# Patient Record
Sex: Female | Born: 1945 | Race: Black or African American | Hispanic: No | Smoking: Former smoker
Health system: Southern US, Community
[De-identification: ages and names within clinical notes are randomized; demographics above are authoritative.]

## PROBLEM LIST (undated history)

## (undated) DIAGNOSIS — J449 Chronic obstructive pulmonary disease, unspecified: Secondary | ICD-10-CM

## (undated) DIAGNOSIS — Z8679 Personal history of other diseases of the circulatory system: Secondary | ICD-10-CM

## (undated) DIAGNOSIS — Z95810 Presence of automatic (implantable) cardiac defibrillator: Secondary | ICD-10-CM

## (undated) DIAGNOSIS — C801 Malignant (primary) neoplasm, unspecified: Secondary | ICD-10-CM

## (undated) HISTORY — PX: ABDOMINAL HYSTERECTOMY: SHX81

---

## 2003-10-12 ENCOUNTER — Ambulatory Visit: Payer: Self-pay | Admitting: Oncology

## 2004-01-29 ENCOUNTER — Ambulatory Visit: Payer: Self-pay | Admitting: Oncology

## 2004-02-12 ENCOUNTER — Ambulatory Visit: Payer: Self-pay | Admitting: Oncology

## 2004-05-12 ENCOUNTER — Ambulatory Visit: Payer: Self-pay | Admitting: Oncology

## 2004-06-18 ENCOUNTER — Ambulatory Visit: Payer: Self-pay | Admitting: Gastroenterology

## 2004-06-29 ENCOUNTER — Ambulatory Visit: Payer: Self-pay | Admitting: Oncology

## 2004-07-10 ENCOUNTER — Ambulatory Visit: Payer: Self-pay | Admitting: Gastroenterology

## 2004-07-30 ENCOUNTER — Ambulatory Visit: Payer: Self-pay | Admitting: Oncology

## 2004-08-06 ENCOUNTER — Ambulatory Visit: Payer: Self-pay | Admitting: Gastroenterology

## 2004-08-11 ENCOUNTER — Ambulatory Visit: Payer: Self-pay | Admitting: Oncology

## 2004-09-04 ENCOUNTER — Ambulatory Visit: Payer: Self-pay | Admitting: Nurse Practitioner

## 2004-09-10 ENCOUNTER — Ambulatory Visit: Payer: Self-pay | Admitting: Nurse Practitioner

## 2004-09-29 ENCOUNTER — Ambulatory Visit: Payer: Self-pay | Admitting: Oncology

## 2004-10-11 ENCOUNTER — Ambulatory Visit: Payer: Self-pay | Admitting: Oncology

## 2004-10-23 ENCOUNTER — Ambulatory Visit: Payer: Self-pay | Admitting: *Deleted

## 2004-12-04 ENCOUNTER — Emergency Department: Payer: Self-pay | Admitting: General Practice

## 2004-12-11 ENCOUNTER — Ambulatory Visit: Payer: Self-pay | Admitting: Oncology

## 2005-01-11 ENCOUNTER — Ambulatory Visit: Payer: Self-pay | Admitting: Oncology

## 2005-02-22 ENCOUNTER — Ambulatory Visit: Payer: Self-pay | Admitting: Oncology

## 2005-03-11 ENCOUNTER — Ambulatory Visit: Payer: Self-pay | Admitting: Oncology

## 2005-04-11 ENCOUNTER — Ambulatory Visit: Payer: Self-pay | Admitting: Oncology

## 2005-07-13 ENCOUNTER — Ambulatory Visit: Payer: Self-pay | Admitting: Oncology

## 2005-08-11 ENCOUNTER — Ambulatory Visit: Payer: Self-pay | Admitting: Oncology

## 2005-10-11 ENCOUNTER — Ambulatory Visit: Payer: Self-pay | Admitting: Oncology

## 2005-11-11 ENCOUNTER — Ambulatory Visit: Payer: Self-pay | Admitting: Oncology

## 2005-12-11 ENCOUNTER — Ambulatory Visit: Payer: Self-pay | Admitting: Oncology

## 2006-02-02 ENCOUNTER — Ambulatory Visit: Payer: Self-pay | Admitting: Oncology

## 2006-02-11 ENCOUNTER — Ambulatory Visit: Payer: Self-pay | Admitting: Oncology

## 2006-03-10 ENCOUNTER — Ambulatory Visit: Payer: Self-pay | Admitting: *Deleted

## 2006-04-05 ENCOUNTER — Ambulatory Visit: Payer: Self-pay | Admitting: Internal Medicine

## 2006-04-05 ENCOUNTER — Ambulatory Visit: Payer: Self-pay | Admitting: Oncology

## 2006-04-12 ENCOUNTER — Ambulatory Visit: Payer: Self-pay | Admitting: Internal Medicine

## 2006-04-20 ENCOUNTER — Ambulatory Visit: Payer: Self-pay | Admitting: *Deleted

## 2006-04-20 ENCOUNTER — Ambulatory Visit: Payer: Self-pay | Admitting: Obstetrics and Gynecology

## 2006-04-22 ENCOUNTER — Ambulatory Visit: Payer: Self-pay | Admitting: Obstetrics and Gynecology

## 2006-04-28 ENCOUNTER — Ambulatory Visit: Payer: Self-pay | Admitting: Oncology

## 2006-05-12 ENCOUNTER — Ambulatory Visit: Payer: Self-pay | Admitting: Oncology

## 2006-06-12 ENCOUNTER — Ambulatory Visit: Payer: Self-pay | Admitting: Oncology

## 2006-06-15 ENCOUNTER — Ambulatory Visit: Payer: Self-pay | Admitting: Pain Medicine

## 2006-06-26 ENCOUNTER — Emergency Department: Payer: Self-pay | Admitting: Emergency Medicine

## 2006-06-27 ENCOUNTER — Ambulatory Visit: Payer: Self-pay | Admitting: Pain Medicine

## 2006-07-07 ENCOUNTER — Ambulatory Visit: Payer: Self-pay | Admitting: Pain Medicine

## 2006-07-11 ENCOUNTER — Ambulatory Visit: Payer: Self-pay | Admitting: Pain Medicine

## 2006-08-01 ENCOUNTER — Ambulatory Visit: Payer: Self-pay | Admitting: Pain Medicine

## 2006-08-08 ENCOUNTER — Ambulatory Visit: Payer: Self-pay | Admitting: Pain Medicine

## 2006-08-22 ENCOUNTER — Ambulatory Visit: Payer: Self-pay | Admitting: Pain Medicine

## 2006-09-12 ENCOUNTER — Ambulatory Visit: Payer: Self-pay | Admitting: Oncology

## 2006-09-14 ENCOUNTER — Ambulatory Visit: Payer: Self-pay | Admitting: Pain Medicine

## 2006-10-03 ENCOUNTER — Ambulatory Visit: Payer: Self-pay | Admitting: Oncology

## 2006-10-12 ENCOUNTER — Ambulatory Visit: Payer: Self-pay | Admitting: Oncology

## 2006-10-21 ENCOUNTER — Ambulatory Visit: Payer: Self-pay | Admitting: Pain Medicine

## 2006-11-21 ENCOUNTER — Ambulatory Visit: Payer: Self-pay | Admitting: Pain Medicine

## 2006-12-19 ENCOUNTER — Ambulatory Visit: Payer: Self-pay | Admitting: Pain Medicine

## 2007-01-19 ENCOUNTER — Ambulatory Visit: Payer: Self-pay | Admitting: Pain Medicine

## 2007-01-25 ENCOUNTER — Ambulatory Visit: Payer: Self-pay | Admitting: Pain Medicine

## 2007-02-28 ENCOUNTER — Ambulatory Visit: Payer: Self-pay | Admitting: Pain Medicine

## 2007-03-08 ENCOUNTER — Ambulatory Visit: Payer: Self-pay | Admitting: Pain Medicine

## 2007-03-12 ENCOUNTER — Ambulatory Visit: Payer: Self-pay | Admitting: Oncology

## 2007-03-28 ENCOUNTER — Ambulatory Visit: Payer: Self-pay | Admitting: Pain Medicine

## 2007-04-03 ENCOUNTER — Ambulatory Visit: Payer: Self-pay | Admitting: Pain Medicine

## 2007-04-05 ENCOUNTER — Ambulatory Visit: Payer: Self-pay | Admitting: Oncology

## 2007-04-12 ENCOUNTER — Ambulatory Visit: Payer: Self-pay | Admitting: Oncology

## 2007-04-25 ENCOUNTER — Ambulatory Visit: Payer: Self-pay | Admitting: *Deleted

## 2007-04-26 ENCOUNTER — Ambulatory Visit: Payer: Self-pay | Admitting: Pain Medicine

## 2007-05-12 ENCOUNTER — Ambulatory Visit: Payer: Self-pay | Admitting: Oncology

## 2007-05-15 ENCOUNTER — Ambulatory Visit: Payer: Self-pay | Admitting: Pain Medicine

## 2007-06-12 ENCOUNTER — Ambulatory Visit: Payer: Self-pay | Admitting: Oncology

## 2007-06-27 ENCOUNTER — Ambulatory Visit: Payer: Self-pay | Admitting: Pain Medicine

## 2007-07-03 ENCOUNTER — Ambulatory Visit: Payer: Self-pay | Admitting: Pain Medicine

## 2007-07-07 IMAGING — CT CT CHEST W/ CM
1 series · 16 of 31 positions shown, 20 images · non-contrast
Comparison: none

REASON FOR EXAM: Recent chest x-ray September 04, 2004 showed increased pulmonary
congestion, RIGHT chest pain
COMMENTS:

[Series 2: soft tissue · axial · 0.71mm/px · z∈[-518,-244]mm · 16 of 61 slices shown, 20 images]
[im 3/61  mediastinal]
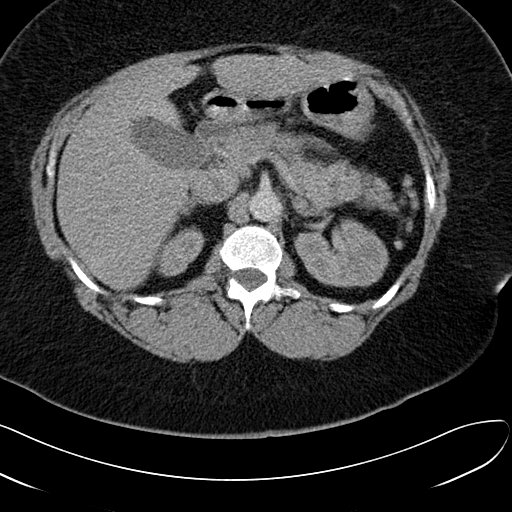
[im 3/61  lung]
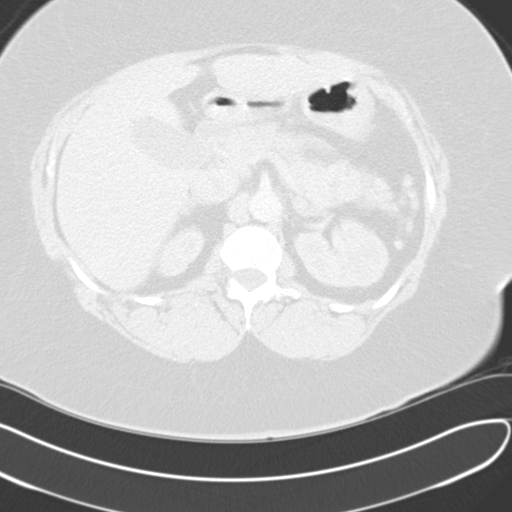
[im 7/61  lung]
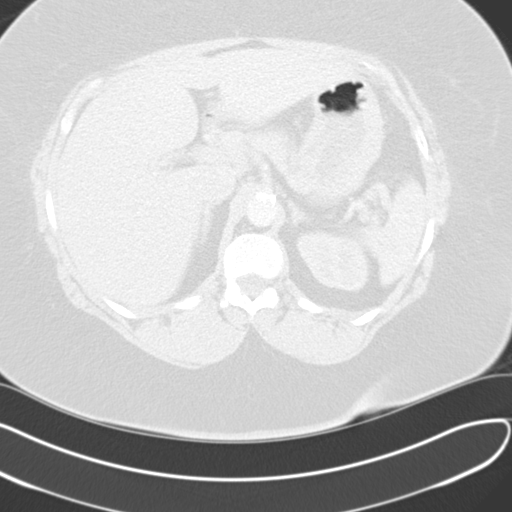
[im 12/61  lung]
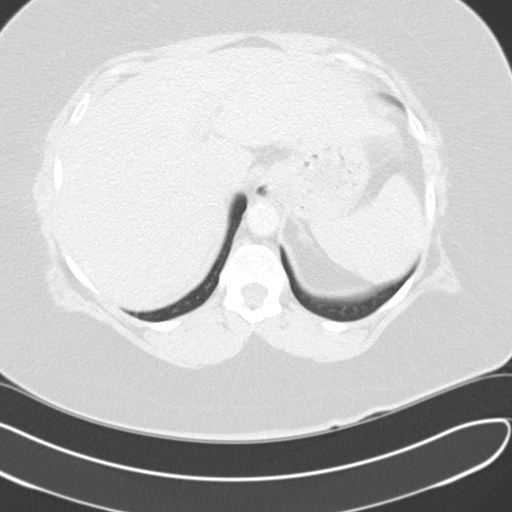
[im 14/61  lung]
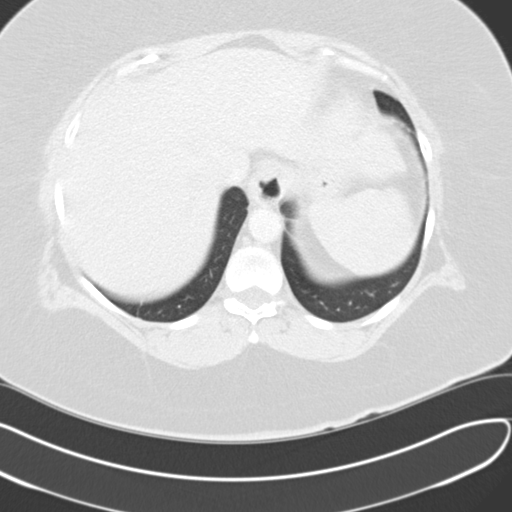
[im 18/61  mediastinal]
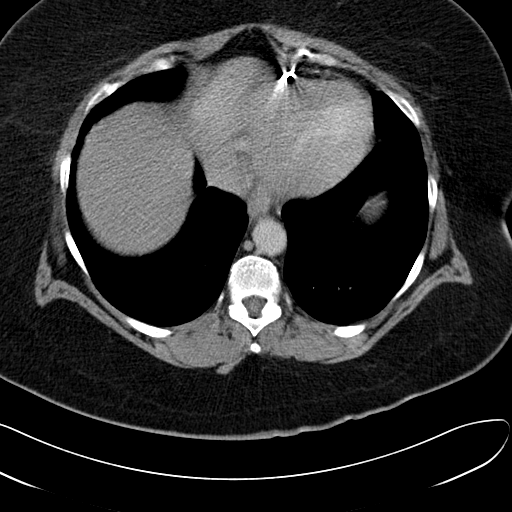
[im 18/61  lung]
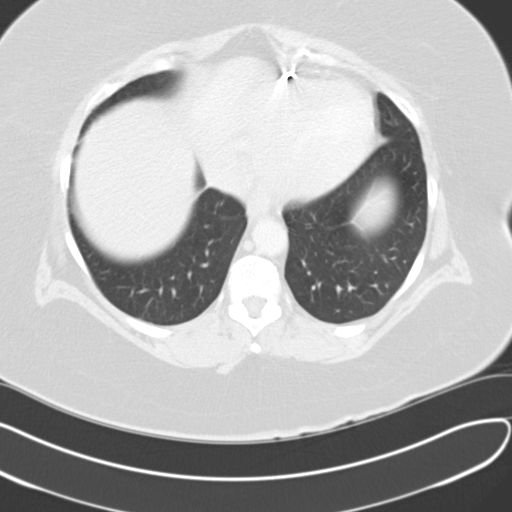
[im 21/61  lung]
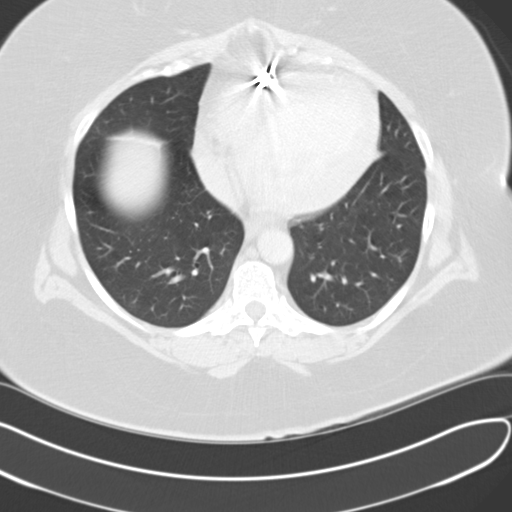
[im 25/61  lung]
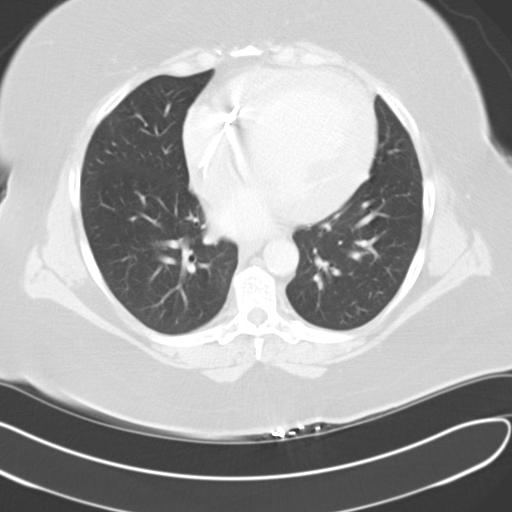
[im 29/61  lung]
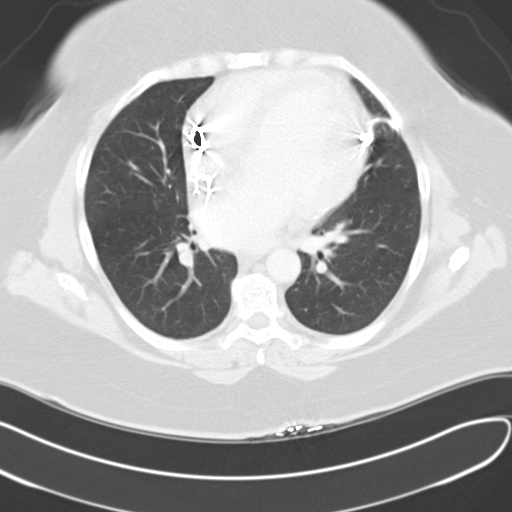
[im 33/61  mediastinal]
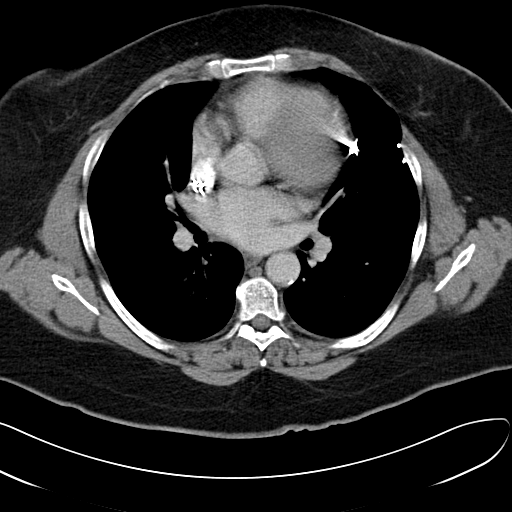
[im 33/61  lung]
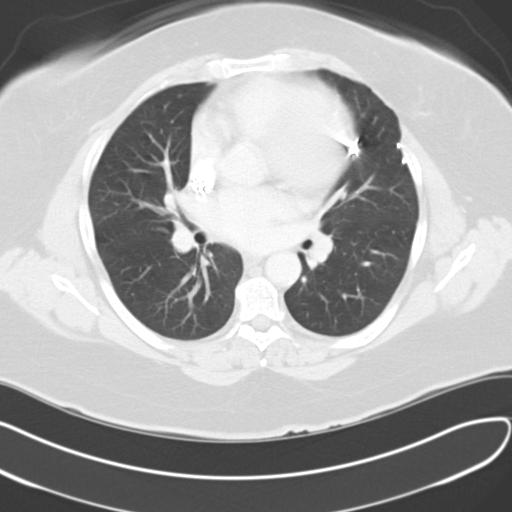
[im 36/61  lung]
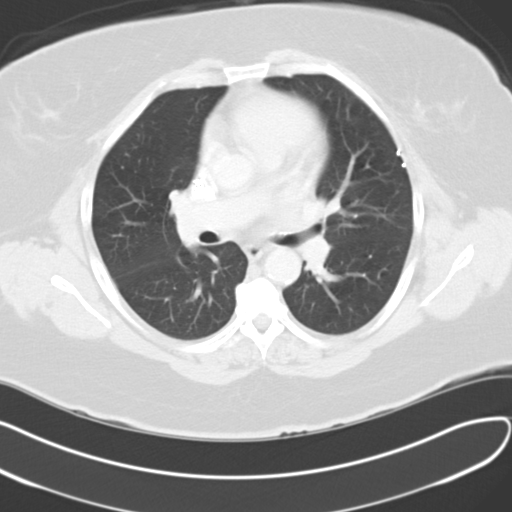
[im 38/61  lung]
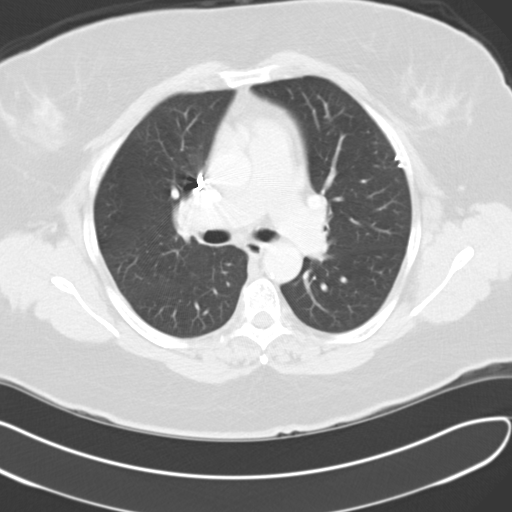
[im 41/61  lung]
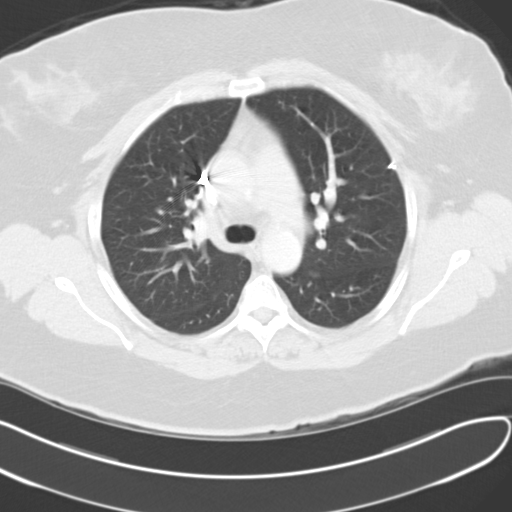
[im 45/61  mediastinal]
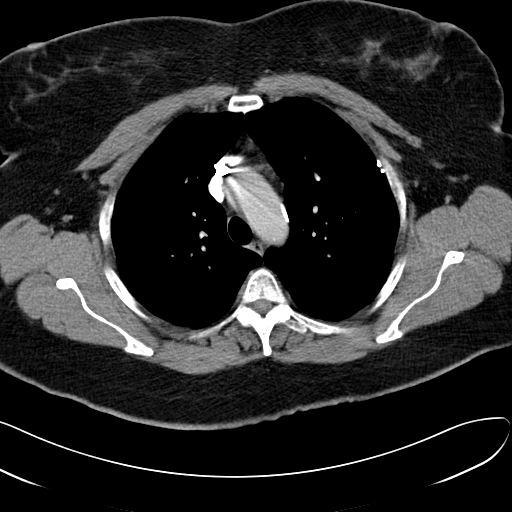
[im 45/61  lung]
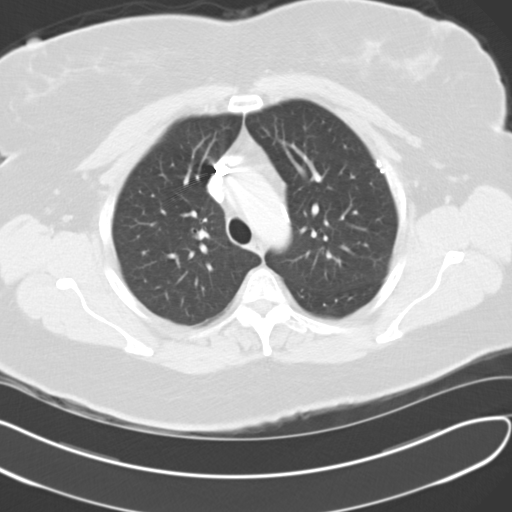
[im 49/61  lung]
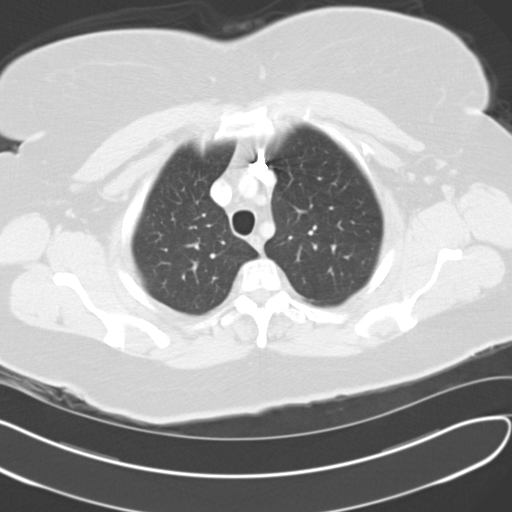
[im 54/61  lung]
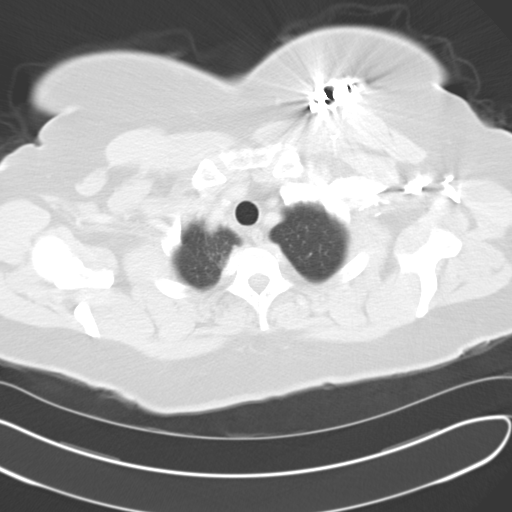
[im 58/61  lung]
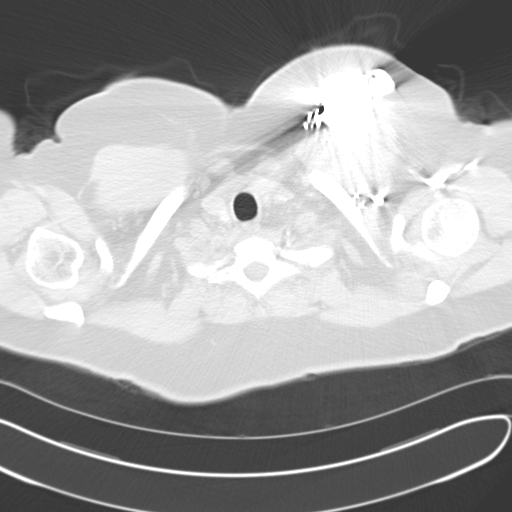

[16 of 31 positions shown; findings below may reference images not displayed]

PROCEDURE:     CT  - CT CHEST WITH CONTRAST  - September 10, 2004  [DATE]

RESULT:     Pacemaker device is present over the LEFT shoulder region.  Two
leads are present, one in the RIGHT atrium and the second in the RIGHT
ventricle.  Lung window images do not show evidence of interstitial edema,
infiltrate, or effusion.  There is no evidence of failure.  The heart is
mildly enlarged.  The pulmonary arterial system does not appear to be
significantly distended nor is the pulmonary venous system.  The upper
abdominal viscera included on this study appear to be unremarkable.
IMPRESSION: Cardiomegaly.

No evidence of pulmonary edema.

No focal mass is present.

Pacemaker device is present.

## 2007-07-12 ENCOUNTER — Ambulatory Visit: Payer: Self-pay | Admitting: Oncology

## 2007-07-25 ENCOUNTER — Ambulatory Visit: Payer: Self-pay | Admitting: Pain Medicine

## 2007-08-02 ENCOUNTER — Ambulatory Visit: Payer: Self-pay | Admitting: Pain Medicine

## 2007-08-12 ENCOUNTER — Ambulatory Visit: Payer: Self-pay | Admitting: Oncology

## 2007-08-29 ENCOUNTER — Ambulatory Visit: Payer: Self-pay | Admitting: Pain Medicine

## 2007-09-04 ENCOUNTER — Ambulatory Visit: Payer: Self-pay | Admitting: Pain Medicine

## 2007-09-28 ENCOUNTER — Ambulatory Visit: Payer: Self-pay | Admitting: Pain Medicine

## 2007-10-09 ENCOUNTER — Ambulatory Visit: Payer: Self-pay | Admitting: Pain Medicine

## 2007-10-24 ENCOUNTER — Ambulatory Visit: Payer: Self-pay | Admitting: Pain Medicine

## 2007-11-01 ENCOUNTER — Ambulatory Visit: Payer: Self-pay | Admitting: Pain Medicine

## 2008-01-11 ENCOUNTER — Ambulatory Visit: Payer: Self-pay | Admitting: Oncology

## 2008-01-12 ENCOUNTER — Ambulatory Visit: Payer: Self-pay | Admitting: Oncology

## 2009-01-03 IMAGING — US US PELV - US TRANSVAGINAL
1 series · 14 of 25 positions shown · non-contrast
Comparison: none

REASON FOR EXAM: LLQ pain  possible s/p total hysterectomy
COMMENTS:

[Series 1: us pelv - us transvaginal · 0.38mm/px · 14 of 38 slices shown]
[im 1/38]
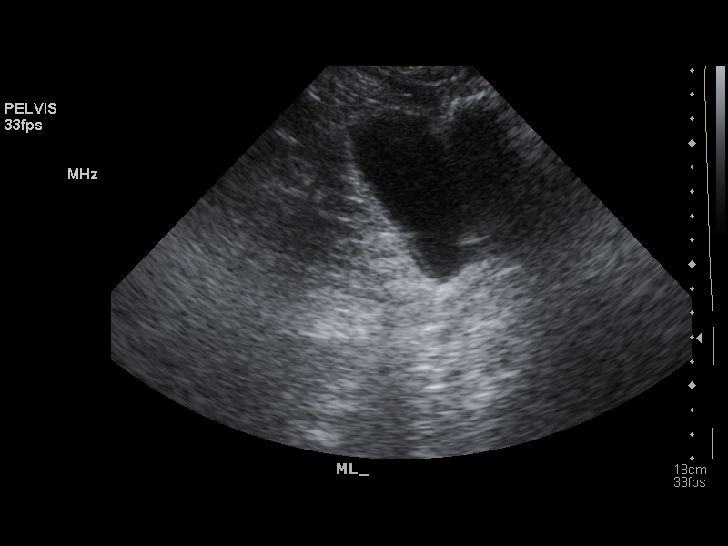
[im 4/38]
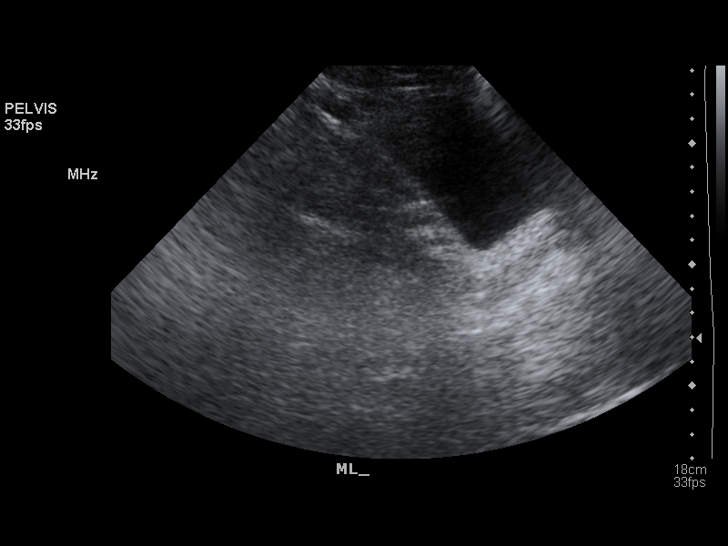
[im 7/38]
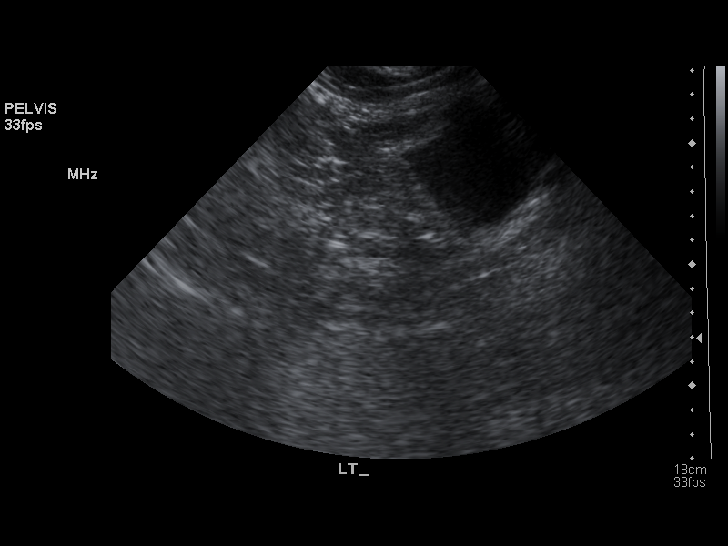
[im 10/38]
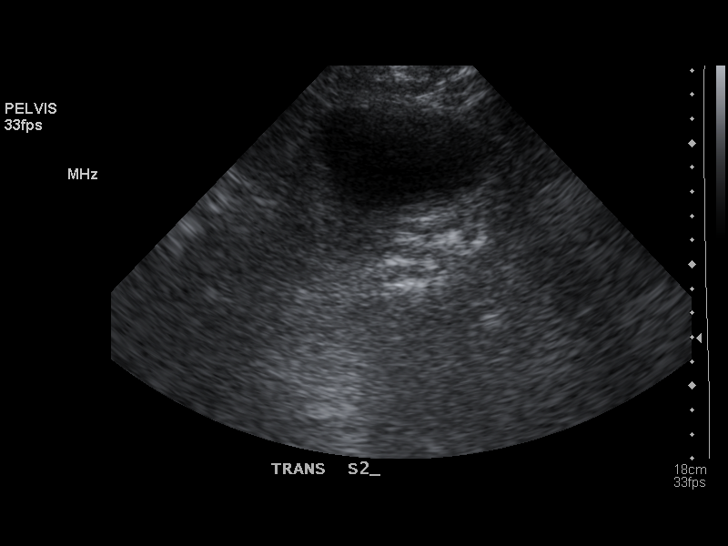
[im 13/38]
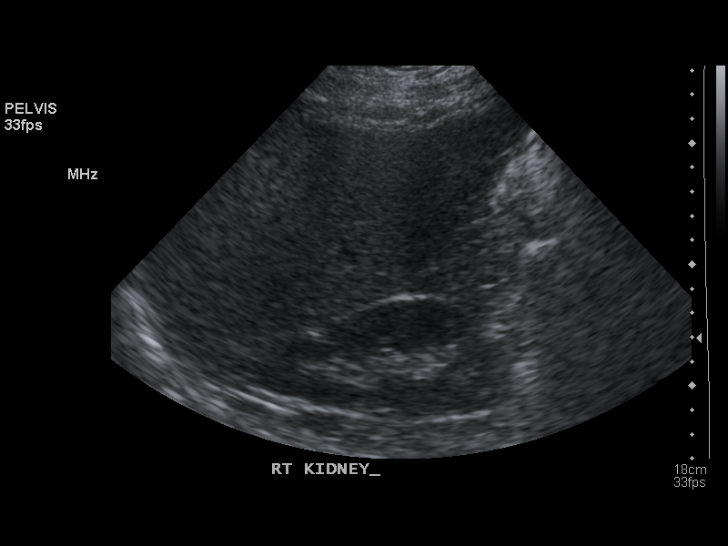
[im 14/38]
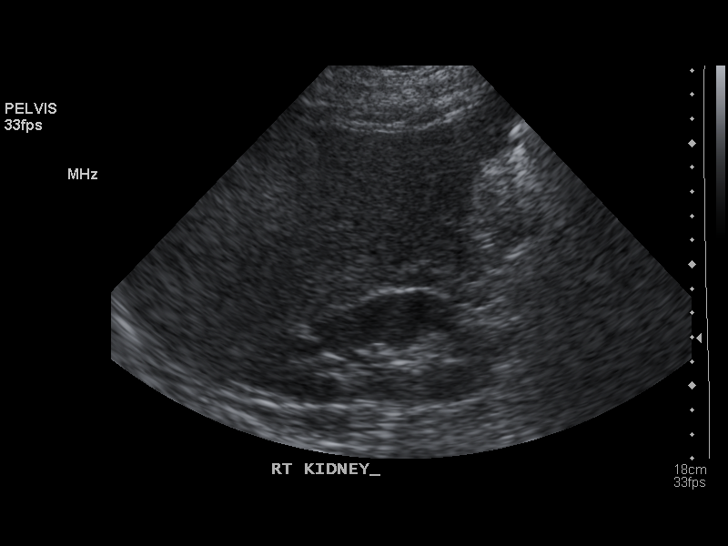
[im 17/38]
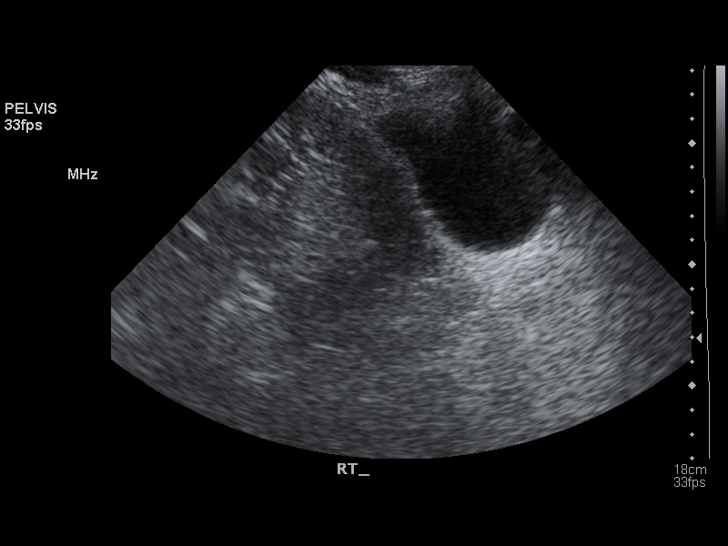
[im 21/38]
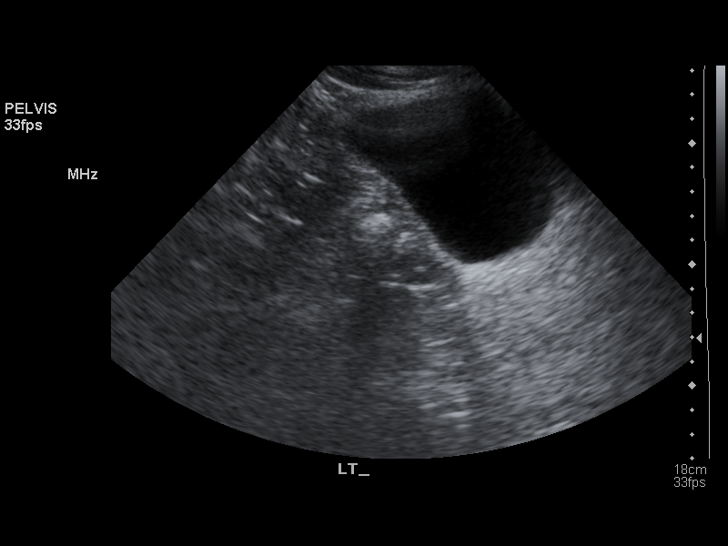
[im 24/38]
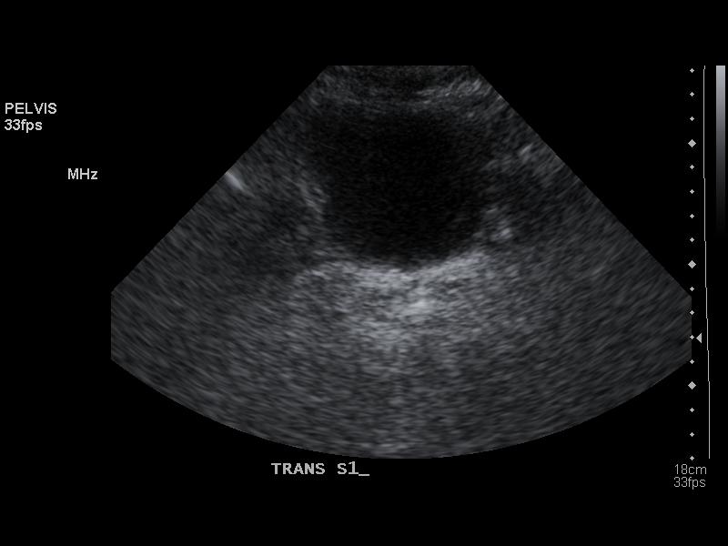
[im 25/38]
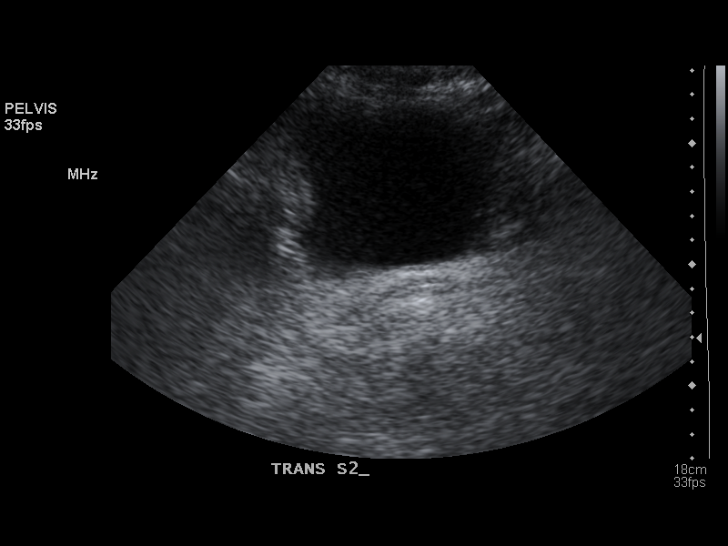
[im 28/38]
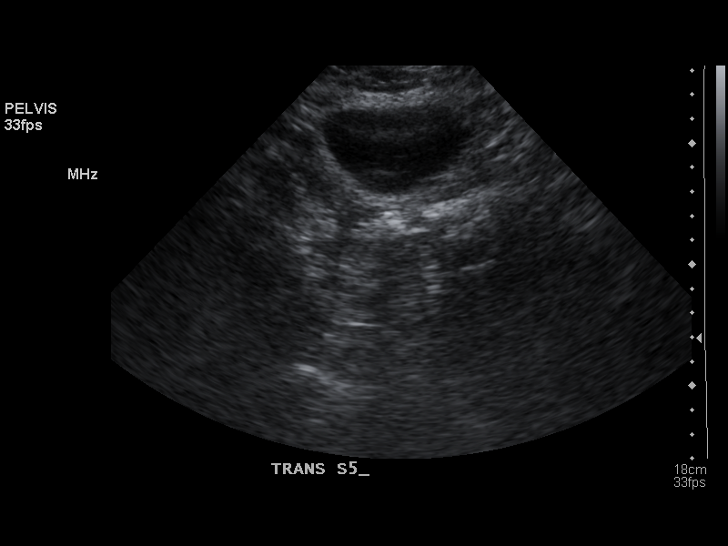
[im 31/38]
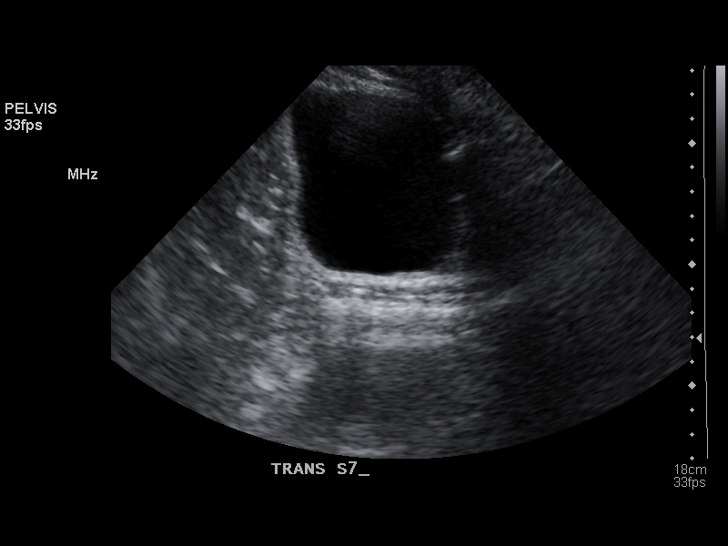
[im 34/38]
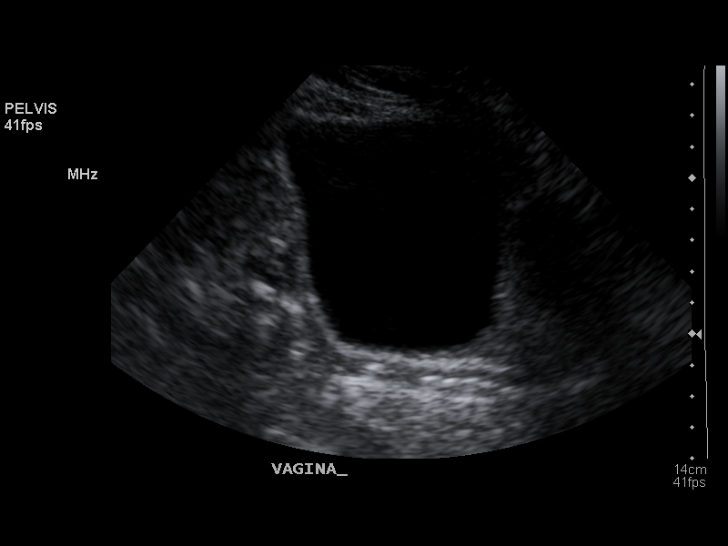
[im 38/38]
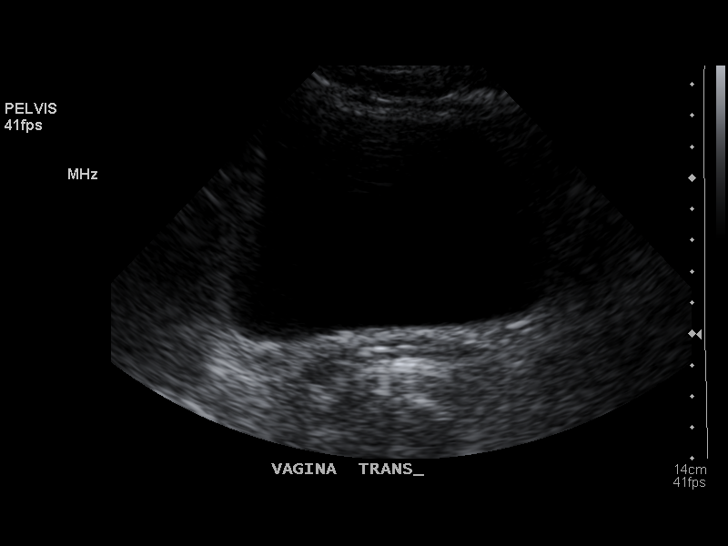

[14 of 25 positions shown; findings below may reference images not displayed]

PROCEDURE:     US  - US PELVIS MASS EXAM  - [DATE] [DATE] [DATE]  [DATE]

RESULT:     The uterus is not seen compatible with prior hysterectomy. The
ovaries also are not seen and have apparently been removed or are atrophic.
No abnormal adnexal masses are seen. No free fluid is noted in the pelvis.
The visualized portion of the urinary bladder is normal in appearance. The
kidneys show no hydronephrosis.
IMPRESSION: 1. Patient is status post hysterectomy.
2. The ovaries are not seen and have apparently been removed.
3. No abnormal adnexal masses are noted.
4. No free fluid is noted the pelvis.

## 2009-06-11 ENCOUNTER — Ambulatory Visit: Payer: Self-pay | Admitting: Oncology

## 2009-07-03 ENCOUNTER — Ambulatory Visit: Payer: Self-pay | Admitting: Oncology

## 2009-07-11 ENCOUNTER — Ambulatory Visit: Payer: Self-pay | Admitting: Oncology

## 2009-08-19 ENCOUNTER — Ambulatory Visit: Payer: Self-pay | Admitting: Pain Medicine

## 2009-08-25 ENCOUNTER — Ambulatory Visit: Payer: Self-pay | Admitting: Pain Medicine

## 2009-09-01 ENCOUNTER — Ambulatory Visit: Payer: Self-pay | Admitting: Pain Medicine

## 2009-09-30 ENCOUNTER — Ambulatory Visit: Payer: Self-pay | Admitting: Pain Medicine

## 2010-10-25 ENCOUNTER — Inpatient Hospital Stay: Payer: Self-pay | Admitting: Specialist

## 2010-10-26 DIAGNOSIS — I428 Other cardiomyopathies: Secondary | ICD-10-CM

## 2010-10-26 DIAGNOSIS — I472 Ventricular tachycardia, unspecified: Secondary | ICD-10-CM

## 2010-10-26 DIAGNOSIS — I509 Heart failure, unspecified: Secondary | ICD-10-CM

## 2011-06-15 ENCOUNTER — Encounter: Payer: Self-pay | Admitting: Family Medicine

## 2011-07-12 ENCOUNTER — Encounter: Payer: Self-pay | Admitting: Family Medicine

## 2011-08-12 ENCOUNTER — Encounter: Payer: Self-pay | Admitting: Family Medicine

## 2011-09-12 ENCOUNTER — Encounter: Payer: Self-pay | Admitting: Family Medicine

## 2011-10-12 ENCOUNTER — Encounter: Payer: Self-pay | Admitting: Family Medicine

## 2012-04-04 ENCOUNTER — Ambulatory Visit: Payer: Self-pay | Admitting: Family Medicine

## 2012-05-25 ENCOUNTER — Emergency Department: Payer: Self-pay | Admitting: Emergency Medicine

## 2012-10-24 ENCOUNTER — Emergency Department: Payer: Self-pay | Admitting: Emergency Medicine

## 2012-10-24 LAB — COMPREHENSIVE METABOLIC PANEL
Albumin: 3.3 g/dL — ABNORMAL LOW (ref 3.4–5.0)
Alkaline Phosphatase: 99 U/L (ref 50–136)
Anion Gap: 4 — ABNORMAL LOW (ref 7–16)
Calcium, Total: 9 mg/dL (ref 8.5–10.1)
Chloride: 108 mmol/L — ABNORMAL HIGH (ref 98–107)
Co2: 25 mmol/L (ref 21–32)
Creatinine: 1 mg/dL (ref 0.60–1.30)
EGFR (African American): >60
EGFR (Non-African Amer.): 58 — ABNORMAL LOW
Osmolality: 279 (ref 275–301)
Potassium: 5 mmol/L (ref 3.5–5.1)
SGPT (ALT): 22 U/L (ref 12–78)
Sodium: 137 mmol/L (ref 136–145)

## 2012-10-24 LAB — URINALYSIS, COMPLETE
Glucose,UR: NEGATIVE mg/dL (ref 0–75)
Ketone: NEGATIVE
Nitrite: POSITIVE
Ph: 5 (ref 4.5–8.0)
RBC,UR: 1 /HPF (ref 0–5)
Squamous Epithelial: 1
WBC UR: 38 /HPF (ref 0–5)

## 2012-10-24 LAB — CBC
HCT: 33.7 % — ABNORMAL LOW (ref 35.0–47.0)
HGB: 11.2 g/dL — ABNORMAL LOW (ref 12.0–16.0)
MCH: 31.4 pg (ref 26.0–34.0)
MCHC: 33.3 g/dL (ref 32.0–36.0)
RBC: 3.58 10*6/uL — ABNORMAL LOW (ref 3.80–5.20)
WBC: 7 10*3/uL (ref 3.6–11.0)

## 2012-11-30 ENCOUNTER — Emergency Department: Payer: Self-pay | Admitting: Internal Medicine

## 2013-10-13 ENCOUNTER — Ambulatory Visit: Payer: Self-pay | Admitting: Family Medicine

## 2014-06-03 ENCOUNTER — Other Ambulatory Visit: Payer: Self-pay | Admitting: Family Medicine

## 2014-06-03 DIAGNOSIS — Z1231 Encounter for screening mammogram for malignant neoplasm of breast: Secondary | ICD-10-CM

## 2014-10-24 ENCOUNTER — Emergency Department: Payer: Medicare HMO

## 2014-10-24 ENCOUNTER — Emergency Department
Admission: EM | Admit: 2014-10-24 | Discharge: 2014-10-24 | Disposition: A | Discharge status: Home / Self Care | Payer: Medicare HMO | Attending: Emergency Medicine | Admitting: Emergency Medicine

## 2014-10-24 ENCOUNTER — Encounter: Payer: Self-pay | Admitting: Emergency Medicine

## 2014-10-24 DIAGNOSIS — S299XXA Unspecified injury of thorax, initial encounter: Secondary | ICD-10-CM | POA: Insufficient documentation

## 2014-10-24 DIAGNOSIS — Y998 Other external cause status: Secondary | ICD-10-CM | POA: Insufficient documentation

## 2014-10-24 DIAGNOSIS — R0789 Other chest pain: Secondary | ICD-10-CM

## 2014-10-24 DIAGNOSIS — Y9289 Other specified places as the place of occurrence of the external cause: Secondary | ICD-10-CM | POA: Insufficient documentation

## 2014-10-24 DIAGNOSIS — Z87891 Personal history of nicotine dependence: Secondary | ICD-10-CM | POA: Insufficient documentation

## 2014-10-24 DIAGNOSIS — Y9389 Activity, other specified: Secondary | ICD-10-CM | POA: Insufficient documentation

## 2014-10-24 DIAGNOSIS — W08XXXA Fall from other furniture, initial encounter: Secondary | ICD-10-CM | POA: Diagnosis not present

## 2014-10-24 HISTORY — DX: Presence of automatic (implantable) cardiac defibrillator: Z95.810

## 2014-10-24 HISTORY — DX: Personal history of other diseases of the circulatory system: Z86.79

## 2014-10-24 LAB — BASIC METABOLIC PANEL
Anion gap: 6 (ref 5–15)
BUN: 25 mg/dL — ABNORMAL HIGH (ref 6–20)
CALCIUM: 9.5 mg/dL (ref 8.9–10.3)
CO2: 28 mmol/L (ref 22–32)
CREATININE: 1.25 mg/dL — AB (ref 0.44–1.00)
Chloride: 105 mmol/L (ref 101–111)
GFR, EST AFRICAN AMERICAN: 50 mL/min — AB (ref >60)
GFR, EST NON AFRICAN AMERICAN: 43 mL/min — AB (ref >60)
Glucose, Bld: 119 mg/dL — ABNORMAL HIGH (ref 65–99)
Potassium: 4.8 mmol/L (ref 3.5–5.1)
SODIUM: 139 mmol/L (ref 135–145)

## 2014-10-24 LAB — CBC
HCT: 38 % (ref 35.0–47.0)
Hemoglobin: 12.3 g/dL (ref 12.0–16.0)
MCH: 30.8 pg (ref 26.0–34.0)
MCHC: 32.4 g/dL (ref 32.0–36.0)
MCV: 94.8 fL (ref 80.0–100.0)
PLATELETS: 128 10*3/uL — AB (ref 150–440)
RBC: 4 MIL/uL (ref 3.80–5.20)
RDW: 15.1 % — ABNORMAL HIGH (ref 11.5–14.5)
WBC: 5.7 10*3/uL (ref 3.6–11.0)

## 2014-10-24 LAB — TROPONIN I

## 2014-10-24 MED ORDER — NAPROXEN 500 MG PO TABS: 500.0000 mg | ORAL_TABLET | Freq: Two times a day (BID) | ORAL | Status: AC

## 2014-10-24 NOTE — Discharge Instructions (Signed)

## 2014-10-24 NOTE — ED Provider Notes (Signed)
[facility] Emergency Department Provider Note  ____________________________________________  Time seen: 4:20 PM  I have reviewed the triage vital signs and the nursing notes.   HISTORY  Chief Complaint Chest Pain    HPI Kimberly Powell is a 69 y.o. female who complains of anterior chest wall pain that started about 4 days ago. Is been constant since then. It hurts to move it also hurts to press on the chest. A week ago she fell out of her shower chair and hit her left side in the front of her chest. Shortly thereafter in the next day or 2 she started having chest wall pain that has persisted since then. No pleuritic or exertional chest pain. No diaphoresis vomiting or radiation. No recent surgeries hospitalizations or long travel.     Past Medical History  Diagnosis Date  . Presence of combination internal cardiac defibrillator (ICD) and pacemaker   . History of congestive heart failure      There are no active problems to display for this patient.    No past surgical history on file. Pacemaker insertion  Current Outpatient Rx  Name  Route  Sig  Dispense  Refill  . naproxen (NAPROSYN) 500 MG tablet   Oral   Take 1 tablet (500 mg total) by mouth 2 (two) times daily with a meal.   20 tablet   0      Allergies Tramadol   No family history on file.  Social History Social History  Substance Use Topics  . Smoking status: Former Research scientist (life sciences)  . Smokeless tobacco: None  . Alcohol Use: No    Review of Systems  Constitutional:   No fever or chills. No weight changes Eyes:   No blurry vision or double vision.  ENT:   No sore throat. Cardiovascular:   Positive as above chest pain. Respiratory:   No dyspnea or cough. Gastrointestinal:   Negative for abdominal pain, vomiting and diarrhea.  No BRBPR or melena. Genitourinary:   Negative for dysuria, urinary retention, bloody urine, or difficulty urinating. Musculoskeletal:   Negative for back  pain. No joint swelling or pain. Skin:   Negative for rash. Neurological:   Negative for headaches, focal weakness or numbness. Psychiatric:  No anxiety or depression.   Endocrine:  No hot/cold intolerance, changes in energy, or sleep difficulty.  10-point ROS otherwise negative.  ____________________________________________   PHYSICAL EXAM:  VITAL SIGNS: ED Triage Vitals  Enc Vitals Group     BP 10/24/14 1243 150/51 mmHg     Pulse Rate 10/24/14 1243 64     Resp 10/24/14 1243 18     Temp 10/24/14 1243 98.2 F (36.8 C)     Temp Source 10/24/14 1243 Oral     SpO2 10/24/14 1243 98 %     Weight 10/24/14 1243 253 lb (114.76 kg)     Height --      Head Cir --      Peak Flow --      Pain Score --      Pain Loc --      Pain Edu? --      Excl. in [place]? --      Constitutional:   Alert and oriented. Well appearing and in no distress. Eyes:   No scleral icterus. No conjunctival pallor. PERRL. EOMI ENT   Head:   Normocephalic and atraumatic.   Nose:   No congestion/rhinnorhea. No septal hematoma   Mouth/Throat:   MMM, no pharyngeal erythema. No  peritonsillar mass. No uvula shift.   Neck:   No stridor. No SubQ emphysema. No meningismus. Hematological/Lymphatic/Immunilogical:   No cervical lymphadenopathy. Cardiovascular:   RRR. Normal and symmetric distal pulses are present in all extremities. No murmurs, rubs, or gallops. Respiratory:   Normal respiratory effort without tachypnea nor retractions. Breath sounds are clear and equal bilaterally. No wheezes/rales/rhonchi. Gastrointestinal:   Soft and nontender. No distention. There is no CVA tenderness.  No rebound, rigidity, or guarding. Genitourinary:   deferred Musculoskeletal:   Nontender with normal range of motion in all extremities. No joint effusions.  No lower extremity tenderness.  No edema. Anterior chest wall very tender over the sternum which reproduces the pain. She also has tenderness in the paraspinous  thoracic musculature. Neurologic:   Normal speech and language.  CN 2-10 normal. Motor grossly intact. No pronator drift.  Normal gait. No gross focal neurologic deficits are appreciated.  Skin:    Skin is warm, dry and intact. No rash noted.  No petechiae, purpura, or bullae. Psychiatric:   Mood and affect are normal. Speech and behavior are normal. Patient exhibits appropriate insight and judgment.  ____________________________________________    LABS (pertinent positives/negatives) (all labs ordered are listed, but only abnormal results are displayed) Labs Reviewed  BASIC METABOLIC PANEL - Abnormal; Notable for the following:    Glucose, Bld 119 (*)    BUN 25 (*)    Creatinine, Ser 1.25 (*)    GFR calc non Af Amer 43 (*)    GFR calc Af Amer 50 (*)    All other components within normal limits  CBC - Abnormal; Notable for the following:    RDW 15.1 (*)    Platelets 128 (*)    All other components within normal limits  TROPONIN I   ____________________________________________   EKG  Interpreted by me Ventricular pacemaker. Left axis, left bundle branch block. Rate of 63. No acute ischemic changes.  ____________________________________________    RADIOLOGY  No acute pathology on chest x-ray. Likely pulmonary venous hypertension  ____________________________________________   PROCEDURES   ____________________________________________   INITIAL IMPRESSION / ASSESSMENT AND PLAN / ED COURSE  Pertinent labs & imaging results that were available during my care of the patient were reviewed by me and considered in my medical decision making (see chart for details).  Patient well appearing no acute distress. Presents with subacute chest wall pain. Exam is consistent with chest wall pain and have low suspicion for ACS PE TAD pneumothorax or carditis mediastinitis pneumonia or sepsis.     ____________________________________________   FINAL CLINICAL IMPRESSION(S)  / ED DIAGNOSES  Final diagnoses:  Chest wall pain      Carrie Mew, MD 10/24/14 [phone]

## 2014-10-24 NOTE — ED Notes (Signed)
Pt here for chest pain. Pt is SOB but states this is baseline. Pt fell at the beginning of the month and feels CP may be related to that. Chest pain increases with movement.

## 2014-11-24 ENCOUNTER — Emergency Department
Admission: EM | Admit: 2014-11-24 | Discharge: 2014-11-24 | Disposition: A | Discharge status: Home / Self Care | Payer: Medicare HMO | Attending: Emergency Medicine | Admitting: Emergency Medicine

## 2014-11-24 ENCOUNTER — Encounter: Payer: Self-pay | Admitting: Emergency Medicine

## 2014-11-24 ENCOUNTER — Emergency Department: Payer: Medicare HMO

## 2014-11-24 DIAGNOSIS — Z87891 Personal history of nicotine dependence: Secondary | ICD-10-CM | POA: Diagnosis not present

## 2014-11-24 DIAGNOSIS — M25512 Pain in left shoulder: Secondary | ICD-10-CM | POA: Insufficient documentation

## 2014-11-24 DIAGNOSIS — Z791 Long term (current) use of non-steroidal anti-inflammatories (NSAID): Secondary | ICD-10-CM | POA: Diagnosis not present

## 2014-11-24 MED ORDER — KETOROLAC TROMETHAMINE 60 MG/2ML IM SOLN
60.0000 mg | Freq: Once | INTRAMUSCULAR | Status: AC
  Administered 2014-11-24: 60 mg via INTRAMUSCULAR
  Filled 2014-11-24: qty 2

## 2014-11-24 MED ORDER — TIZANIDINE HCL 4 MG PO TABS: 4.0000 mg | ORAL_TABLET | Freq: Four times a day (QID) | ORAL | Status: AC | PRN

## 2014-11-24 MED ORDER — KETOROLAC TROMETHAMINE 10 MG PO TABS: 10.0000 mg | ORAL_TABLET | Freq: Four times a day (QID) | ORAL | Status: AC | PRN

## 2014-11-24 NOTE — ED Provider Notes (Signed)
[facility] Emergency Department Provider Note ____________________________________________  Time seen: Approximately 2:43 PM  I have reviewed the triage vital signs and the nursing notes.   HISTORY  Chief Complaint Fall   HPI Kimberly Powell is a 69 y.o. female who presents to the emergency department for evaluation of left shoulder pain. She states that she fell first week of September and injured her left shoulder. She states that the pain did not start immediately, but gradually developed over time. She states that she has had a negative x-ray at [facility], but feels that the pain is worsening instead of getting better. She's had no relief with over-the-counter medications and Percocet. She states that she is unable to find a comfortable position. She has not seen an orthopedist. She states that she is here today to see an orthopedic specialist and was advised that if she came into the emergency department one would be available for her today. She denies new injury.   Past Medical History  Diagnosis Date  . Presence of combination internal cardiac defibrillator (ICD) and pacemaker   . History of congestive heart failure     There are no active problems to display for this patient.   History reviewed. No pertinent past surgical history.  Current Outpatient Rx  Name  Route  Sig  Dispense  Refill  . ketorolac (TORADOL) 10 MG tablet   Oral   Take 1 tablet (10 mg total) by mouth every 6 (six) hours as needed.   20 tablet   0   . naproxen (NAPROSYN) 500 MG tablet   Oral   Take 1 tablet (500 mg total) by mouth 2 (two) times daily with a meal.   20 tablet   0   . tiZANidine (ZANAFLEX) 4 MG tablet   Oral   Take 1 tablet (4 mg total) by mouth every 6 (six) hours as needed for muscle spasms.   30 tablet   0     Allergies Tramadol  No family history on file.  Social History Social History  Substance Use Topics  . Smoking status: Former Research scientist (life sciences)  .  Smokeless tobacco: None  . Alcohol Use: No    Review of Systems Constitutional: No recent illness. Eyes: No visual changes. ENT: No sore throat. Cardiovascular: Denies chest pain or palpitations. Respiratory: Denies shortness of breath. Gastrointestinal: No abdominal pain.  Genitourinary: Negative for dysuria. Musculoskeletal: Pain in left shoulder Skin: Negative for rash. Neurological: Negative for headaches, focal weakness or numbness. 10-point ROS otherwise negative.  ____________________________________________   PHYSICAL EXAM:  VITAL SIGNS: ED Triage Vitals  Enc Vitals Group     BP 11/24/14 1214 125/57 mmHg     Pulse Rate 11/24/14 1214 55     Resp 11/24/14 1214 20     Temp 11/24/14 1214 98.1 F (36.7 C)     Temp Source 11/24/14 1214 Oral     SpO2 11/24/14 1214 96 %     Weight 11/24/14 1214 255 lb (115.667 kg)     Height 11/24/14 1214 5\' 5"  (1.651 m)     Head Cir --      Peak Flow --      Pain Score 11/24/14 1214 8     Pain Loc --      Pain Edu? --      Excl. in [place]? --     Constitutional: Alert and oriented. Well appearing and in no acute distress. Eyes: Conjunctivae are normal. EOMI. Head: Atraumatic. Nose: No congestion/rhinnorhea.  Neck: No stridor.  Respiratory: Normal respiratory effort.   Musculoskeletal: Full range of motion of left shoulder, but tenderness with even the lightest palpation diffusely over the entire joint. No obvious deformity noted. Neurologic:  Normal speech and language. No gross focal neurologic deficits are appreciated. Speech is normal. No gait instability. Skin:  Skin is warm, dry and intact. Atraumatic. Psychiatric: Mood and affect are normal. Speech and behavior are normal.  ____________________________________________   LABS (all labs ordered are listed, but only abnormal results are displayed)  Labs Reviewed - No data to display ____________________________________________  RADIOLOGY  Negative for acute  abnormality. ____________________________________________   PROCEDURES  Procedure(s) performed:  Sling applied to the left arm for comfort.   ____________________________________________   INITIAL IMPRESSION / ASSESSMENT AND PLAN / ED COURSE  Pertinent labs & imaging results that were available during my care of the patient were reviewed by me and considered in my medical decision making (see chart for details).  Patient was advised to follow-up with orthopedics. She was advised to call tomorrow to schedule an appointment. She was advised to take medication as prescribed. She was encouraged to return to the emergency department for symptoms that change or worsen if she is unable schedule an appointment. ____________________________________________   FINAL CLINICAL IMPRESSION(S) / ED DIAGNOSES  Final diagnoses:  Shoulder pain, acute, left       Victorino Dike, FNP 11/24/14 1557  Lavonia Drafts, MD 11/24/14 [phone]

## 2014-11-24 NOTE — ED Notes (Addendum)
States she fell in the first week in sept. States she fell from commode.. Hit left shoulder and neck  Did not have pain at that time   States she is having more severe pain for the past 2 weeks

## 2014-11-24 NOTE — Discharge Instructions (Signed)

## 2015-06-05 ENCOUNTER — Other Ambulatory Visit: Payer: Self-pay | Admitting: Family Medicine

## 2015-06-05 DIAGNOSIS — Z1231 Encounter for screening mammogram for malignant neoplasm of breast: Secondary | ICD-10-CM

## 2015-06-05 DIAGNOSIS — Z13820 Encounter for screening for osteoporosis: Secondary | ICD-10-CM

## 2015-07-01 ENCOUNTER — Ambulatory Visit
Admission: RE | Admit: 2015-07-01 | Discharge: 2015-07-01 | Disposition: A | Discharge status: Home / Self Care | Payer: Medicare HMO | Source: Ambulatory Visit | Attending: Family Medicine | Admitting: Family Medicine

## 2015-07-01 ENCOUNTER — Other Ambulatory Visit: Payer: Self-pay | Admitting: Family Medicine

## 2015-07-01 DIAGNOSIS — Z1231 Encounter for screening mammogram for malignant neoplasm of breast: Secondary | ICD-10-CM | POA: Insufficient documentation

## 2015-07-01 DIAGNOSIS — M85851 Other specified disorders of bone density and structure, right thigh: Secondary | ICD-10-CM | POA: Diagnosis not present

## 2015-07-01 DIAGNOSIS — R928 Other abnormal and inconclusive findings on diagnostic imaging of breast: Secondary | ICD-10-CM | POA: Diagnosis not present

## 2015-07-01 DIAGNOSIS — Z13820 Encounter for screening for osteoporosis: Secondary | ICD-10-CM | POA: Insufficient documentation

## 2015-07-01 HISTORY — DX: Malignant (primary) neoplasm, unspecified: C80.1

## 2015-07-08 ENCOUNTER — Other Ambulatory Visit: Payer: Self-pay | Admitting: Family Medicine

## 2015-07-08 DIAGNOSIS — N6459 Other signs and symptoms in breast: Secondary | ICD-10-CM

## 2015-07-08 DIAGNOSIS — R921 Mammographic calcification found on diagnostic imaging of breast: Secondary | ICD-10-CM

## 2015-07-28 ENCOUNTER — Other Ambulatory Visit: Payer: Self-pay | Admitting: Family Medicine

## 2015-07-28 ENCOUNTER — Ambulatory Visit
Admission: RE | Admit: 2015-07-28 | Discharge: 2015-07-28 | Disposition: A | Discharge status: Home / Self Care | Payer: Medicare HMO | Source: Ambulatory Visit | Attending: Family Medicine | Admitting: Family Medicine

## 2015-07-28 DIAGNOSIS — N6459 Other signs and symptoms in breast: Secondary | ICD-10-CM

## 2015-07-28 DIAGNOSIS — R6889 Other general symptoms and signs: Secondary | ICD-10-CM | POA: Insufficient documentation

## 2015-07-28 DIAGNOSIS — R921 Mammographic calcification found on diagnostic imaging of breast: Secondary | ICD-10-CM

## 2015-08-01 ENCOUNTER — Other Ambulatory Visit: Payer: Self-pay | Admitting: Family Medicine

## 2015-08-01 DIAGNOSIS — R921 Mammographic calcification found on diagnostic imaging of breast: Secondary | ICD-10-CM

## 2015-08-13 ENCOUNTER — Ambulatory Visit
Admission: RE | Admit: 2015-08-13 | Discharge: 2015-08-13 | Disposition: A | Discharge status: Home / Self Care | Payer: Medicare HMO | Source: Ambulatory Visit | Attending: Family Medicine | Admitting: Family Medicine

## 2015-08-13 DIAGNOSIS — N6022 Fibroadenosis of left breast: Secondary | ICD-10-CM | POA: Insufficient documentation

## 2015-08-13 DIAGNOSIS — R921 Mammographic calcification found on diagnostic imaging of breast: Secondary | ICD-10-CM

## 2015-08-13 HISTORY — PX: BREAST BIOPSY: SHX20

## 2015-08-14 LAB — SURGICAL PATHOLOGY

## 2015-12-18 ENCOUNTER — Emergency Department: Payer: Medicare HMO

## 2015-12-18 ENCOUNTER — Emergency Department
Admission: EM | Admit: 2015-12-18 | Discharge: 2015-12-18 | Disposition: A | Discharge status: Home / Self Care | Payer: Medicare HMO | Attending: Emergency Medicine | Admitting: Emergency Medicine

## 2015-12-18 ENCOUNTER — Encounter: Payer: Self-pay | Admitting: Emergency Medicine

## 2015-12-18 DIAGNOSIS — J449 Chronic obstructive pulmonary disease, unspecified: Secondary | ICD-10-CM | POA: Diagnosis not present

## 2015-12-18 DIAGNOSIS — S82002A Unspecified fracture of left patella, initial encounter for closed fracture: Secondary | ICD-10-CM

## 2015-12-18 DIAGNOSIS — Z79899 Other long term (current) drug therapy: Secondary | ICD-10-CM | POA: Insufficient documentation

## 2015-12-18 DIAGNOSIS — Y92009 Unspecified place in unspecified non-institutional (private) residence as the place of occurrence of the external cause: Secondary | ICD-10-CM | POA: Insufficient documentation

## 2015-12-18 DIAGNOSIS — Z87891 Personal history of nicotine dependence: Secondary | ICD-10-CM | POA: Insufficient documentation

## 2015-12-18 DIAGNOSIS — M1710 Unilateral primary osteoarthritis, unspecified knee: Secondary | ICD-10-CM

## 2015-12-18 DIAGNOSIS — I509 Heart failure, unspecified: Secondary | ICD-10-CM | POA: Insufficient documentation

## 2015-12-18 DIAGNOSIS — W1839XA Other fall on same level, initial encounter: Secondary | ICD-10-CM | POA: Diagnosis not present

## 2015-12-18 DIAGNOSIS — M1712 Unilateral primary osteoarthritis, left knee: Secondary | ICD-10-CM | POA: Insufficient documentation

## 2015-12-18 DIAGNOSIS — W19XXXA Unspecified fall, initial encounter: Secondary | ICD-10-CM

## 2015-12-18 DIAGNOSIS — Z85850 Personal history of malignant neoplasm of thyroid: Secondary | ICD-10-CM | POA: Diagnosis not present

## 2015-12-18 DIAGNOSIS — S82454A Nondisplaced comminuted fracture of shaft of right fibula, initial encounter for closed fracture: Secondary | ICD-10-CM | POA: Insufficient documentation

## 2015-12-18 DIAGNOSIS — Z791 Long term (current) use of non-steroidal anti-inflammatories (NSAID): Secondary | ICD-10-CM | POA: Diagnosis not present

## 2015-12-18 DIAGNOSIS — Y999 Unspecified external cause status: Secondary | ICD-10-CM | POA: Diagnosis not present

## 2015-12-18 DIAGNOSIS — S8991XA Unspecified injury of right lower leg, initial encounter: Secondary | ICD-10-CM | POA: Diagnosis present

## 2015-12-18 DIAGNOSIS — S82832A Other fracture of upper and lower end of left fibula, initial encounter for closed fracture: Secondary | ICD-10-CM | POA: Insufficient documentation

## 2015-12-18 DIAGNOSIS — Y939 Activity, unspecified: Secondary | ICD-10-CM | POA: Diagnosis not present

## 2015-12-18 HISTORY — DX: Chronic obstructive pulmonary disease, unspecified: J44.9

## 2015-12-18 MED ORDER — ACETAMINOPHEN 325 MG PO TABS
ORAL_TABLET | ORAL | Status: AC
  Filled 2015-12-18: qty 2

## 2015-12-18 MED ORDER — ACETAMINOPHEN 325 MG PO TABS
650.0000 mg | ORAL_TABLET | Freq: Once | ORAL | Status: AC
  Administered 2015-12-18: 650 mg via ORAL

## 2015-12-18 NOTE — ED Provider Notes (Signed)
[facility] Emergency Department Provider Note  ____________________________________________  Time seen: Approximately 3:12 PM  I have reviewed the triage vital signs and the nursing notes.   HISTORY  Chief Complaint Ankle Pain and Knee Pain    HPI Kimberly Powell is a 70 y.o. female , NAD, presents to emergency department with 1 week history of right ankle and left knee pain. Patient states she fell on her home due to her "knees giving out" approximately one week ago. Has had worsening pain about her right ankle and left knee since that time. Denies head injury, LOC, lightheadedness or dizziness. Has had no neck pain or back pain. Denies any saddle paresthesias or loss of bowel or bladder control. Has had no numbness, weakness, tingling. Has noted some swelling about her right ankle and lower leg but no abnormal warmth or redness. Has no open wounds or lacerations.   Past Medical History:  Diagnosis Date  . Cancer ([place])    thyroid  . COPD (chronic obstructive pulmonary disease) ([place])   . History of congestive heart failure   . Presence of combination internal cardiac defibrillator (ICD) and pacemaker     There are no active problems to display for this patient.   Past Surgical History:  Procedure Laterality Date  . ABDOMINAL HYSTERECTOMY    . BREAST BIOPSY Left 08/13/2015   x2 path pending    Prior to Admission medications   Medication Sig Start Date End Date Taking? Authorizing Provider  ketorolac (TORADOL) 10 MG tablet Take 1 tablet (10 mg total) by mouth every 6 (six) hours as needed. 11/24/14   Victorino Dike, FNP  naproxen (NAPROSYN) 500 MG tablet Take 1 tablet (500 mg total) by mouth 2 (two) times daily with a meal. 10/24/14   Carrie Mew, MD  tiZANidine (ZANAFLEX) 4 MG tablet Take 1 tablet (4 mg total) by mouth every 6 (six) hours as needed for muscle spasms. 11/24/14   Victorino Dike, FNP    Allergies Tramadol  Family History   Problem Relation Age of Onset  . Breast cancer Mother     Social History Social History  Substance Use Topics  . Smoking status: Former Research scientist (life sciences)  . Smokeless tobacco: Never Used  . Alcohol use No     Review of Systems  Constitutional: No fever/chills Eyes: No visual changes.  Cardiovascular: No chest pain. Respiratory:  No shortness of breath.  Gastrointestinal: No abdominal pain.  No nausea, vomiting.  Musculoskeletal: Positive left knee and right ankle pain. Negative for back, Neck pain.  Skin: Positive swelling right lower leg, right ankle. Negative for rash, redness, abnormal warmth, bruising, open wounds or lacerations. Neurological: Negative for headaches, focal weakness or numbness. No tingling. No saddle paresthesias or loss of bowel or bladder control. No LOC, lightheadedness, dizziness. 10-point ROS otherwise negative.  ____________________________________________   PHYSICAL EXAM:  VITAL SIGNS: ED Triage Vitals [12/18/15 1230]  Enc Vitals Group     BP (!) 108/44     Pulse Rate (!) 59     Resp 18     Temp 97.4 F (36.3 C)     Temp Source Oral     SpO2 96 %     Weight 280 lb (127 kg)     Height 5\' 5"  (1.651 m)     Head Circumference      Peak Flow      Pain Score 10     Pain Loc      Pain Edu?  Excl. in [place]?      Constitutional: Alert and oriented. Well appearing and in no acute distress. Eyes: Conjunctivae are normal.   Head: Atraumatic. Cardiovascular: Good peripheral circulation With 2+ pulses noted in bilateral lower extremities. Respiratory: Normal respiratory effort without tachypnea or retractions.  Musculoskeletal: Tenderness to palpation about the anterior left knee without any crepitus or bony abnormality. No laxity with anterior or posterior drawer. No laxity with varus or valgus stress. Full range of motion of the left knee but pain with full flexion. Tenderness to palpation about the anterior, distal portion of the right lower leg and  right lateral malleolus. No crepitus or bony abnormalities noted. Patient has full range of motion of all joints of the right lower extremity with some pain with full flexion and extension of the right ankle. No lower extremity edema.  No joint effusions. Neurologic:  Normal speech and language. No gross focal neurologic deficits are appreciated. Incision light touch grossly intact about bilateral lower extremities. Skin:  Positive swelling right lower leg and ankle. Skin is warm, dry and intact. No rash, redness, abnormal warmth noted. Psychiatric: Mood and affect are normal. Speech and behavior are normal. Patient exhibits appropriate insight and judgement.   ____________________________________________   LABS  None ____________________________________________  EKG  None ____________________________________________  RADIOLOGY I, Braxton Feathers, personally viewed and evaluated these images (plain radiographs) as part of my medical decision making, as well as reviewing the written report by the radiologist.  Dg Tibia/fibula Right  Result Date: 12/18/2015 CLINICAL DATA:  70 y/o  F; right ankle pain after fall. EXAM: RIGHT TIBIA AND FIBULA - 2 VIEW COMPARISON:  12/18/2015 right ankle radiographs FINDINGS: Comminuted nondisplaced fracture of the lower fibula above the level of tibial plafond. No tibial fracture identified. Osteoarthrosis of the partially visualized knee joint with periarticular osteophytes and femorotibial joint space narrowing greater laterally. Age-indeterminate avulsion fracture fragments adjacent to medial malleolus. IMPRESSION: Comminuted nondisplaced fracture of the lower right fibula above the level of tibial plafond. Age-indeterminate avulsion fracture fragments adjacent to right medial malleolus. Osteoarthrosis of the knee. Electronically Signed   By: Kristine Garbe M.D.   On: 12/18/2015 15:55   Dg Ankle Complete Right  Result Date: 12/18/2015 CLINICAL DATA:   70 year old female status post fall 1 week ago. Subsequent lateral ankle pain. Initial encounter. EXAM: RIGHT ANKLE - COMPLETE 3+ VIEW COMPARISON:  Right foot series [id number]. FINDINGS: Comminuted but nondisplaced fracture through the distal right fibula shaft, not completely included on these images. Mortise joint alignment is preserved. Talar dome intact. Small age indeterminate fragment adjacent to the medial malleolus which otherwise appears intact. Calcaneus intact with degenerative spurring. Soft tissue swelling diffusely about the ankle. IMPRESSION: 1. Comminuted but nondisplaced fracture of the distal right fibula shaft. 2. Small age indeterminate avulsion fragment adjacent to the medial malleolus. 3. No other acute fracture or dislocation identified about the right ankle. Electronically Signed   By: Genevie Ann M.D.   On: 12/18/2015 14:13   Dg Knee Complete 4 Views Left  Result Date: 12/18/2015 CLINICAL DATA:  70 year old female status post fall 1 week ago. Subsequent knee pain. Initial encounter. EXAM: LEFT KNEE - COMPLETE 4+ VIEW COMPARISON:  Left knee series 10315. FINDINGS: Chronic severe tricompartmental knee joint degeneration with bulky osteophytosis re - demonstrated. Suggestion of small suprapatellar joint effusion today. New curvilinear lucency through the distal pole of the patella seen on images 3 and 4, where as superior lateral patellar irregularity is stable since 2015.  No displacement. No other acute osseous abnormality identified. IMPRESSION: 1. Suspicion of nondisplaced fracture through the distal pole of the patella. Small joint effusion. 2. 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 Electronically Signed   By: Genevie Ann M.D.   On: 12/18/2015 14:11    ____________________________________________    PROCEDURES  Procedure(s) performed: None   Procedures   Medications  acetaminophen (TYLENOL) tablet 650 mg (650 mg Oral Given 12/18/15 1517)      ____________________________________________   INITIAL IMPRESSION / ASSESSMENT AND PLAN / ED COURSE  Pertinent labs & imaging results that were available during my care of the patient were reviewed by me and considered in my medical decision making (see chart for details).  Clinical Course as of Dec 18 2255  Thu Dec 18, 2015  1610 I spoke with Dr. Sabra Heck who is the physician on-call with orthopedics in regards to the patient's presentation and imaging results. He suggests that we splint the right lower leg, place and knee immobilizer on the left knee and the patient may ambulate with a walker. Patient is to call his office and schedule an appointment for Monday for follow-up.  [JH]  1611 Information and all imaging results were relayed to the patient. She relays that she has a walker at home that she would prefer to use.  [id number] Patient also notes she has adverse events such as hallucinations when taking narcotic pain medications. Patient will continue Tylenol as needed for pain.  [JH]    Clinical Course User Index [JH] Jami L Hagler, PA-C    Patient's diagnosis is consistent with A closed nondisplaced fracture of the shaft of the right fibula, closed nondisplaced fracture of the left patella due to fall at home. Patient's left knee was placed in a knee immobilizer and the right lower leg splinted in a posterior splint. Patient has a walker at home in which she will use for ambulation. Patient will be discharged home with instructions to take over-the-counter Tylenol as needed for pain. Patient is to follow up with Dr. Sabra Heck in orthopedics on Monday for further evaluation and treatment of fractures. Patient is given ED precautions to return to the ED for any worsening or new symptoms.    ____________________________________________  FINAL CLINICAL IMPRESSION(S) / ED DIAGNOSES  Final diagnoses:  Closed nondisplaced comminuted fracture of shaft of right fibula, initial encounter   Closed nondisplaced fracture of left patella, unspecified fracture morphology, initial encounter  Fall at home, initial encounter  Arthritis of knee      NEW MEDICATIONS STARTED DURING THIS VISIT:  Discharge Medication List as of 12/18/2015  4:26 PM           Braxton Feathers, PA-C 12/18/15 [place], MD 12/21/15 1721

## 2015-12-18 NOTE — Discharge Instructions (Signed)
Please take Tylenol as needed for pain. 

## 2015-12-18 NOTE — ED Triage Notes (Signed)
Pt comes into the ED via POV c/o right ankle pain and left knee pain after a fall she took last week.  Patient states she attempted to wait it out, but the patient cannot handle the pain anymore.  Patient presents in NAd at this time with even and unlabored respirations.  Denies hitting her head when she fell.

## 2016-01-27 ENCOUNTER — Encounter: Payer: Self-pay | Admitting: Medical Oncology

## 2016-01-27 ENCOUNTER — Emergency Department
Admission: EM | Admit: 2016-01-27 | Discharge: 2016-01-27 | Disposition: A | Discharge status: Other Acute Inpatient Hospital | Payer: Medicare PPO | Attending: Emergency Medicine | Admitting: Emergency Medicine

## 2016-01-27 ENCOUNTER — Emergency Department: Payer: Medicare PPO

## 2016-01-27 DIAGNOSIS — Z87891 Personal history of nicotine dependence: Secondary | ICD-10-CM | POA: Diagnosis not present

## 2016-01-27 DIAGNOSIS — Z79899 Other long term (current) drug therapy: Secondary | ICD-10-CM | POA: Diagnosis not present

## 2016-01-27 DIAGNOSIS — I509 Heart failure, unspecified: Secondary | ICD-10-CM | POA: Diagnosis not present

## 2016-01-27 DIAGNOSIS — Z95810 Presence of automatic (implantable) cardiac defibrillator: Secondary | ICD-10-CM | POA: Insufficient documentation

## 2016-01-27 DIAGNOSIS — Z7982 Long term (current) use of aspirin: Secondary | ICD-10-CM | POA: Diagnosis not present

## 2016-01-27 DIAGNOSIS — J449 Chronic obstructive pulmonary disease, unspecified: Secondary | ICD-10-CM | POA: Insufficient documentation

## 2016-01-27 DIAGNOSIS — I110 Hypertensive heart disease with heart failure: Secondary | ICD-10-CM | POA: Insufficient documentation

## 2016-01-27 DIAGNOSIS — Z85850 Personal history of malignant neoplasm of thyroid: Secondary | ICD-10-CM | POA: Insufficient documentation

## 2016-01-27 DIAGNOSIS — R0600 Dyspnea, unspecified: Secondary | ICD-10-CM

## 2016-01-27 LAB — CBC
HCT: 34.9 % — ABNORMAL LOW (ref 35.0–47.0)
Hemoglobin: 11.3 g/dL — ABNORMAL LOW (ref 12.0–16.0)
MCH: 30.4 pg (ref 26.0–34.0)
MCHC: 32.5 g/dL (ref 32.0–36.0)
MCV: 93.6 fL (ref 80.0–100.0)
Platelets: 107 10*3/uL — ABNORMAL LOW (ref 150–440)
RBC: 3.73 MIL/uL — AB (ref 3.80–5.20)
RDW: 15.8 % — AB (ref 11.5–14.5)
WBC: 4.8 10*3/uL (ref 3.6–11.0)

## 2016-01-27 LAB — BASIC METABOLIC PANEL
Anion gap: 8 (ref 5–15)
BUN: 29 mg/dL — AB (ref 6–20)
CALCIUM: 9.2 mg/dL (ref 8.9–10.3)
CO2: 33 mmol/L — ABNORMAL HIGH (ref 22–32)
Chloride: 97 mmol/L — ABNORMAL LOW (ref 101–111)
Creatinine, Ser: 1.79 mg/dL — ABNORMAL HIGH (ref 0.44–1.00)
GFR calc non Af Amer: 28 mL/min — ABNORMAL LOW (ref >60)
GFR, EST AFRICAN AMERICAN: 32 mL/min — AB (ref >60)
Glucose, Bld: 101 mg/dL — ABNORMAL HIGH (ref 65–99)
Potassium: 4 mmol/L (ref 3.5–5.1)
SODIUM: 138 mmol/L (ref 135–145)

## 2016-01-27 LAB — INFLUENZA PANEL BY PCR (TYPE A & B)
INFLBPCR: NEGATIVE
Influenza A By PCR: NEGATIVE

## 2016-01-27 LAB — MAGNESIUM: Magnesium: 2 mg/dL (ref 1.7–2.4)

## 2016-01-27 LAB — TROPONIN I: TROPONIN I: 0.04 ng/mL — AB (ref <0.03)

## 2016-01-27 LAB — BRAIN NATRIURETIC PEPTIDE: B Natriuretic Peptide: 222 pg/mL — ABNORMAL HIGH (ref 0.0–100.0)

## 2016-01-27 MED ORDER — FUROSEMIDE 10 MG/ML IJ SOLN
INTRAMUSCULAR | Status: AC
  Filled 2016-01-27: qty 4

## 2016-01-27 MED ORDER — FUROSEMIDE 10 MG/ML IJ SOLN
60.0000 mg | Freq: Once | INTRAMUSCULAR | Status: AC
  Administered 2016-01-27: 60 mg via INTRAVENOUS
  Filled 2016-01-27: qty 8

## 2016-01-27 NOTE — ED Provider Notes (Signed)
[facility] Emergency Department Provider Note  Time seen: 6:58 PM  I have reviewed the triage vital signs and the nursing notes.   HISTORY  Chief Complaint Chest Pain and Leg Pain    HPI Kimberly Powell is a 71 y.o. female with a past medical history of hypertension, systolic and diastolic heart failure, mitral regurgitation who presents to the emergency department with difficulty breathing. 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 Patient was admitted to [facility] 1/11 for a mitral valve procedure, discharge 1/14. Patient states since going home she has had progressive shortness of breath. Presents to the emergency department today with increased difficulty breathing and chest tightness. States mild lower extremity edema as well. Denies any lower short any pain. Patient is currently taking aspirin and Plavix. Positive for cough. Denies fever.  Past Medical History:  Diagnosis Date  . Cancer ([place])    thyroid  . COPD (chronic obstructive pulmonary disease) ([place])   . History of congestive heart failure   . Presence of combination internal cardiac defibrillator (ICD) and pacemaker     There are no active problems to display for this patient.   Past Surgical History:  Procedure Laterality Date  . ABDOMINAL HYSTERECTOMY    . BREAST BIOPSY Left 08/13/2015   x2 path pending    Prior to Admission medications   Medication Sig Start Date End Date Taking? Authorizing Provider  ketorolac (TORADOL) 10 MG tablet Take 1 tablet (10 mg total) by mouth every 6 (six) hours as needed. 11/24/14   Victorino Dike, FNP  naproxen (NAPROSYN) 500 MG tablet Take 1 tablet (500 mg total) by mouth 2 (two) times daily with a meal. 10/24/14   Carrie Mew, MD  tiZANidine (ZANAFLEX) 4 MG tablet Take 1 tablet (4 mg total) by mouth every 6 (six) hours as needed for muscle  spasms. 11/24/14   Victorino Dike, FNP    Allergies  Allergen Reactions  . Primidone   . Sotalol   . Tramadol Other (See Comments)    "Hallucinations"    Family History  Problem Relation Age of Onset  . Breast cancer Mother     Social History Social History  Substance Use Topics  . Smoking status: Former Research scientist (life sciences)  . Smokeless tobacco: Never Used  . Alcohol use No    Review of Systems Constitutional: Negative for fever. Cardiovascular: Positive for chest tightness. Respiratory: Positive for shortness of breath Gastrointestinal: Negative for abdominal pain Neurological: Negative for headache 10-point ROS otherwise negative.  ____________________________________________   PHYSICAL EXAM:  VITAL SIGNS: ED Triage Vitals  Enc Vitals Group     BP 01/27/16 1844 126/66     Pulse Rate 01/27/16 1844 63     Resp 01/27/16 1844 (!) 21     Temp 01/27/16 1844 98.1 F (36.7 C)     Temp Source 01/27/16 1844 Oral     SpO2 01/27/16 1836 91 %     Weight 01/27/16 1840 272 lb (123.4 kg)     Height 01/27/16 1840 5\' 5"  (1.651 m)     Head Circumference --      Peak Flow --      Pain Score 01/27/16 1841 8     Pain Loc --      Pain Edu? --      Excl. in [place]? --     Constitutional: Alert and oriented.  Well appearing and in no distress. Eyes: Normal exam ENT   Head: Normocephalic and atraumatic.   Mouth/Throat: Mucous membranes are moist. Cardiovascular: Normal rate, regular rhythm. No murmurs, rubs, or gallops. Respiratory: Mild tachypnea. Diffuse rhonchi/rales on exam. No audible wheeze. Gastrointestinal: Soft and nontender. No distention.  Musculoskeletal: Nontender with normal range of motion in all extremities. Mild pedal edema equal bilaterally without calf tenderness to palpation. Neurologic:  Normal speech and language. No gross focal neurologic deficits  Skin:  Skin is warm, dry and intact.  Psychiatric: Mood and affect are normal.    ____________________________________________    EKG  EKG reviewed and interpreted by myself shows an atrial sensed ventricular paced rhythm at 63 bpm with no concerning findings.  ____________________________________________    RADIOLOGY  Chest x-ray consistent with mild CHF  ____________________________________________   INITIAL IMPRESSION / ASSESSMENT AND PLAN / ED COURSE  Pertinent labs & imaging results that were available during my care of the patient were reviewed by me and considered in my medical decision making (see chart for details).  Patient presents to the emergency department 5 days after a mitral valve procedure/clipping with shortness of breath. Patient currently satting 89-91 percent on room air, no home O2 requirement at baseline. Diffuse rhonchi/rales in all lung fields. We will check labs, chest x-ray, influenza swab and closely monitor in the emergency department. Highly suspect CHF exacerbation.  Chest x-ray consistent with mild CHF. Patient's troponin 0.04. Patient's creatinine is elevated from 1 last week to currently 1.79. Patient maintains a good oxygen saturation on 2 L of oxygen however desats into the 80s on room air. Given likely CHF exacerbation we will dose Lasix and discussed with [facility] given a valve repair 5 days ago for likely transfer.  Patient accepted to [facility] for transfer.  ____________________________________________   FINAL CLINICAL IMPRESSION(S) / ED DIAGNOSES  Dyspnea Chest pain CHF exacerbation   Harvest Dark, MD 01/27/16 2321

## 2016-01-27 NOTE — ED Notes (Signed)
Lab in to perform lab draw as patient has minimal venipuncture sites.

## 2016-01-27 NOTE — ED Triage Notes (Signed)
Pt from home via ems with reports of central chest pain that has been intermittent since she was at [facility] on 1/11 to have a procedure to repair leaky valve. Pt c/o sob and bilt leg pain also. Pt was 91% on RA when ems arrived, placed on 3L [place].

## 2016-01-27 NOTE — ED Notes (Signed)
Lab draw successful. Performed by  Tyrone Apple., Phleb.

## 2016-01-27 NOTE — ED Notes (Signed)
Patient's cell phone and charger sent with patient as well as packet of paperwork  [facility] discharge on 01/11

## 2016-01-27 NOTE — ED Notes (Signed)
Spoke with patient's daughter and let her know that the patient was leaving the ED and headed for [facility]. Advised her that her mother had her cell phone, Games developer and paperwork from [facility].

## 2016-01-27 NOTE — ED Notes (Signed)
Report from Velta Addison, RN

## 2016-01-27 NOTE — ED Notes (Signed)
Report given to Raquel Sarna, RN at [facility]. Patient will go to room 3704

## 2016-01-27 NOTE — ED Notes (Signed)
Lab called to report elevated troponin of 0.04. Will notify MD of same.

## 2016-01-27 NOTE — ED Notes (Signed)
Patient repositioned in bed for comfort. Daughter is going home at this time as patient is to be admitted. Jacqlyn Larsen, [phone]

## 2016-01-27 NOTE — ED Notes (Signed)
Called and spoke to patient's daughter, Jacqlyn Larsen to inform her of patient transfer and patient's room number at [facility]

## 2016-01-27 NOTE — ED Notes (Signed)
Lab called to obtain labs. 2 unsuccessful attempts.

## 2016-02-12 DEATH — deceased

## 2018-10-13 IMAGING — CR DG ANKLE COMPLETE 3+V*R*
1 series · 3 of 3 positions shown · non-contrast
Comparison: Right foot series [DATE].

CLINICAL DATA: 70-year-old female status post fall 1 week ago.
Subsequent lateral ankle pain. Initial encounter.

EXAM:
RIGHT ANKLE - COMPLETE 3+ VIEW

[Series 1: x ankle ap right · 0.14mm/px · 3 of 3 slices shown]
[im 1/3]
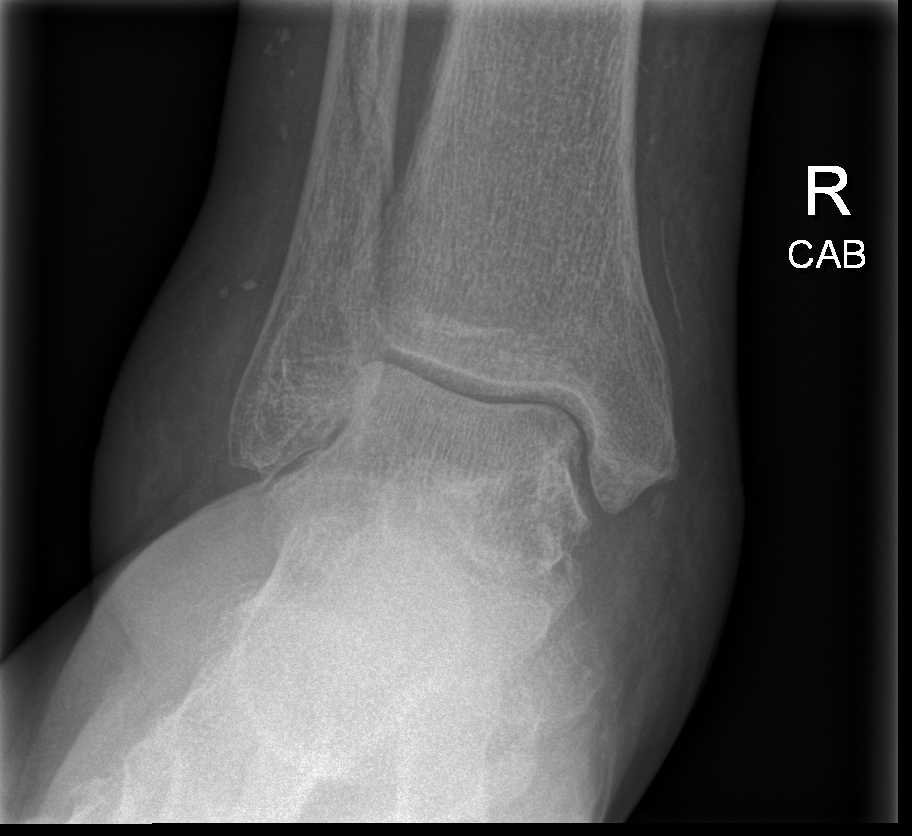
[im 2/3]
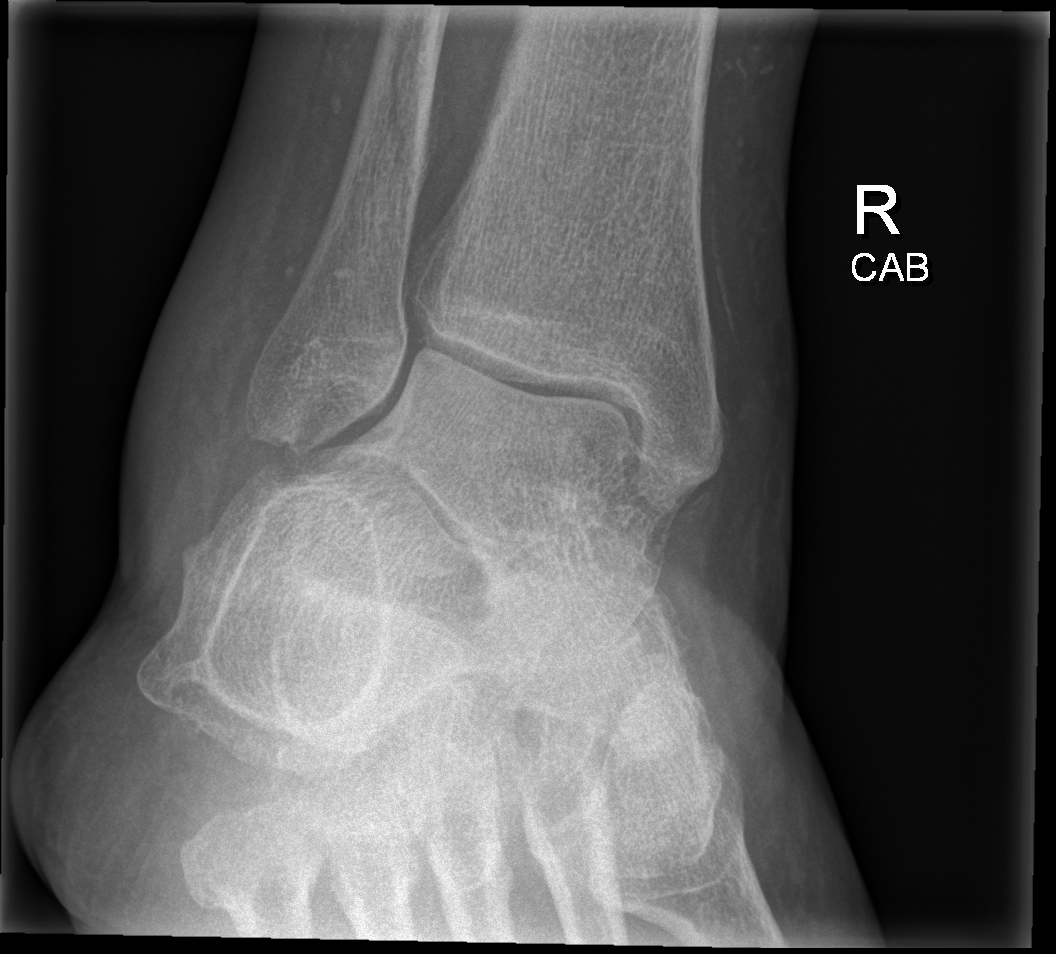
[im 3/3]
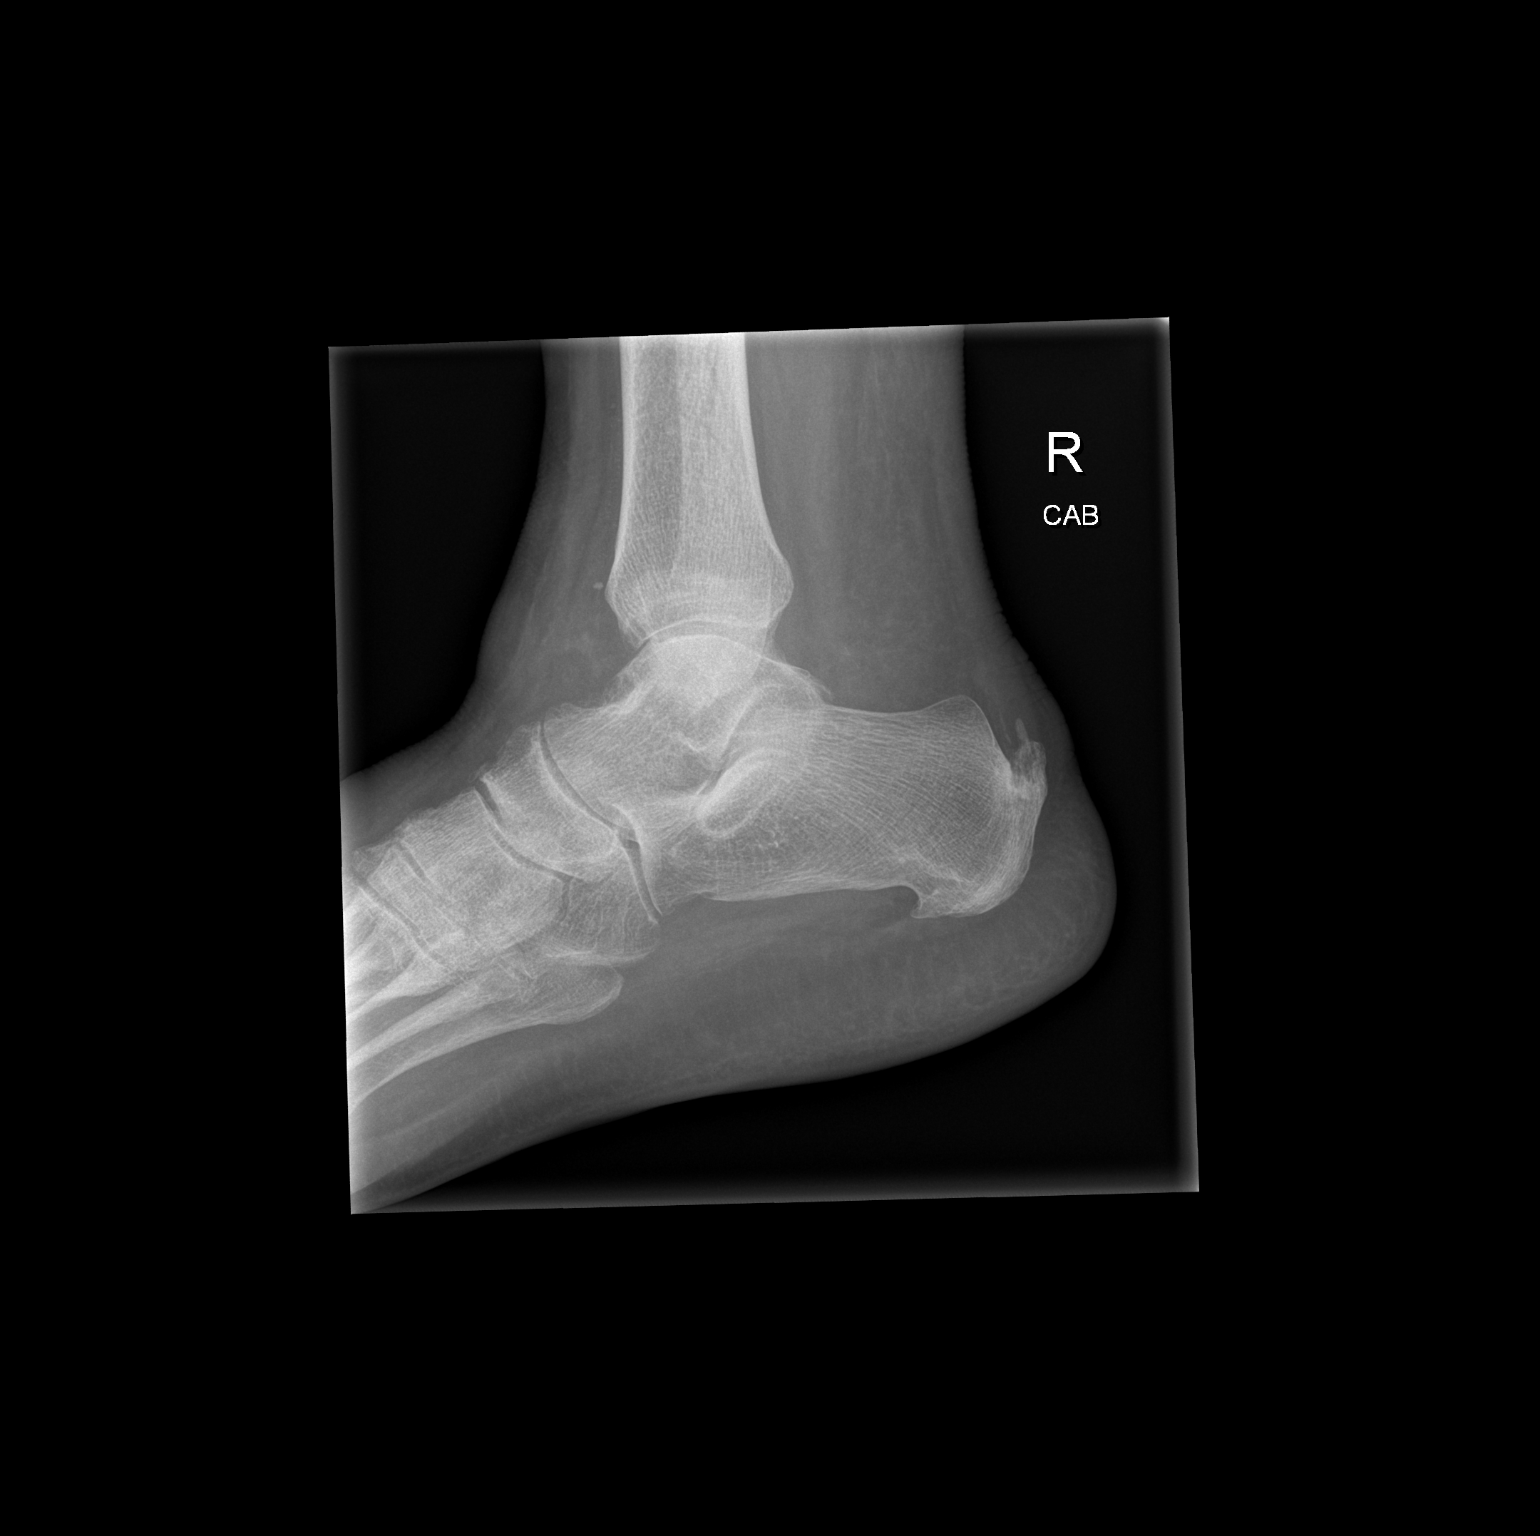

[3 of 3 positions shown; findings below may reference images not displayed]

FINDINGS: Comminuted but nondisplaced fracture through the distal right fibula
shaft, not completely included on these images. Mortise joint
alignment is preserved. Talar dome intact. Small age indeterminate
fragment adjacent to the medial malleolus which otherwise appears
intact. Calcaneus intact with degenerative spurring. Soft tissue
swelling diffusely about the ankle.
IMPRESSION: 1. Comminuted but nondisplaced fracture of the distal right fibula
shaft.
2. Small age indeterminate avulsion fragment adjacent to the medial
malleolus.
3. No other acute fracture or dislocation identified about the right
ankle.

## 2018-10-13 IMAGING — CR DG KNEE COMPLETE 4+V*L*
1 series · 4 of 4 positions shown · non-contrast
Comparison: Left knee series [id number].

CLINICAL DATA: 70-year-old female status post fall 1 week ago.
Subsequent knee pain. Initial encounter.

EXAM:
LEFT KNEE - COMPLETE 4+ VIEW

[Series 1: t knee ap left · 0.14mm/px · 4 of 4 slices shown]
[im 1/4]
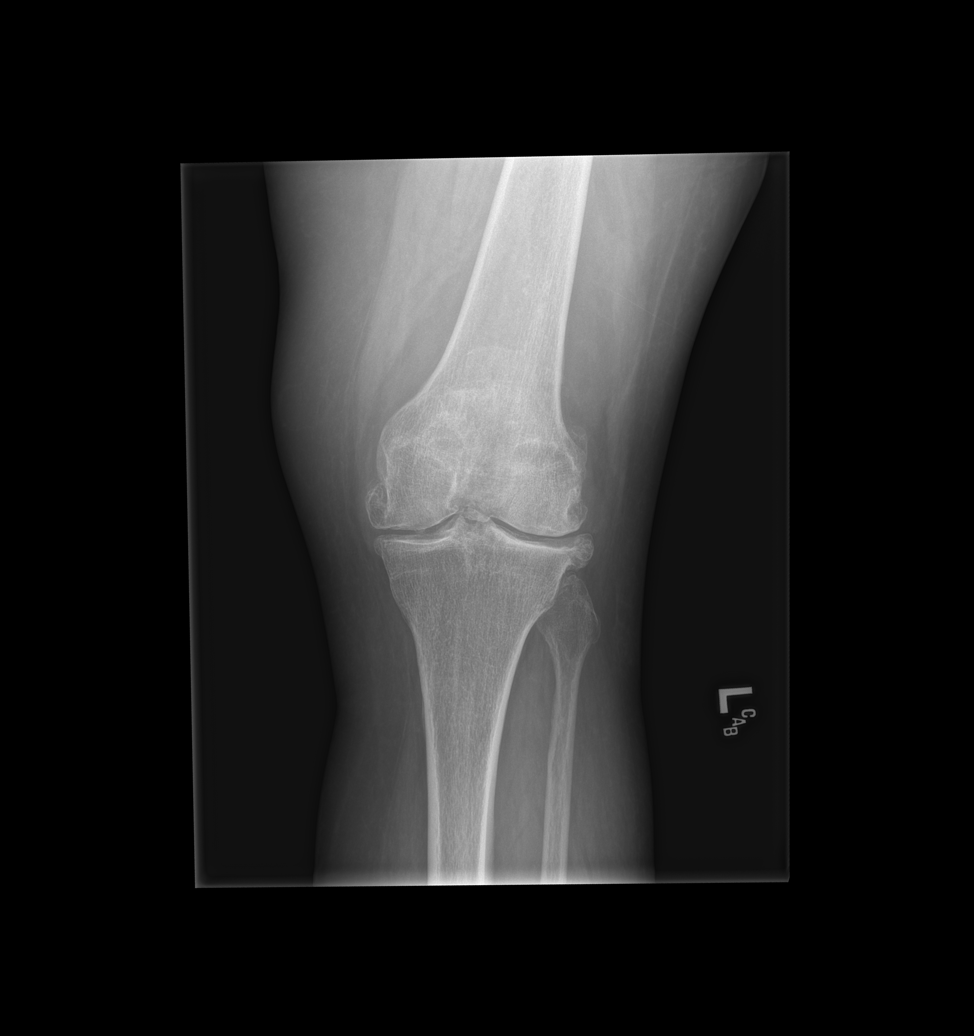
[im 2/4]
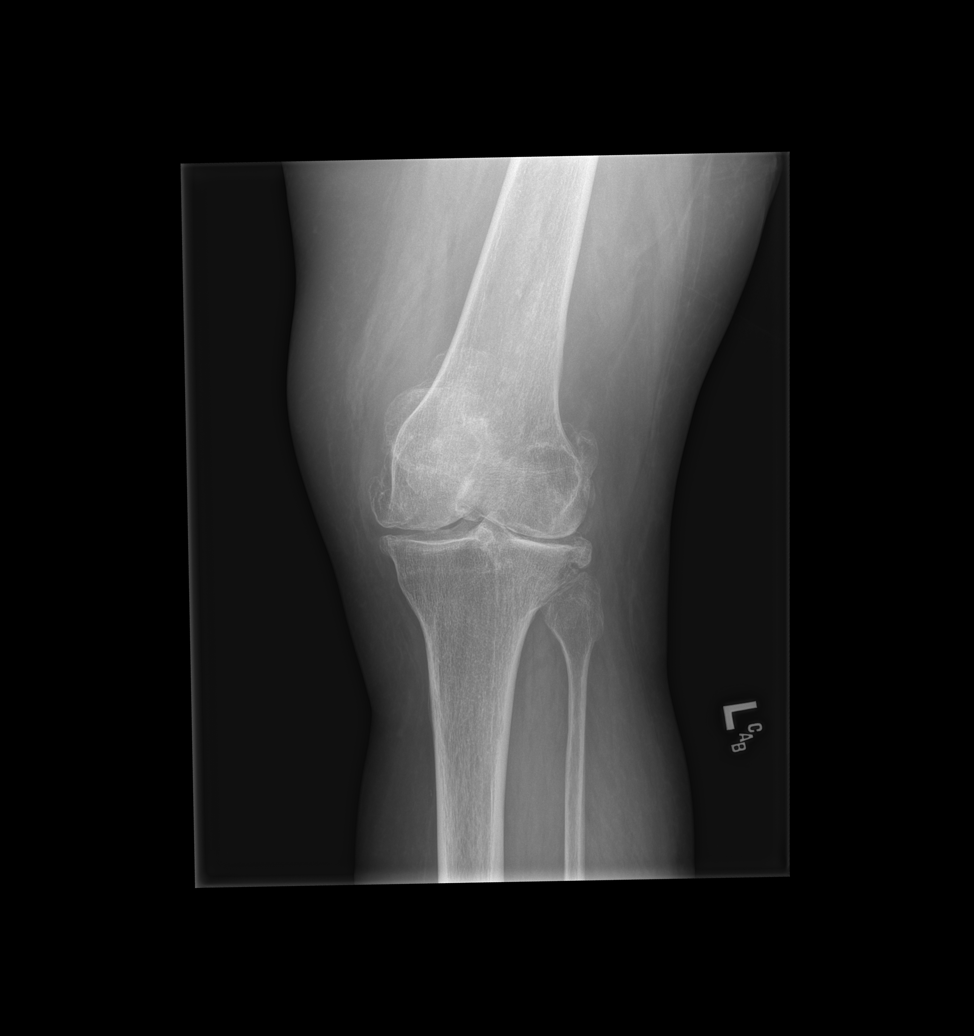
[im 3/4]
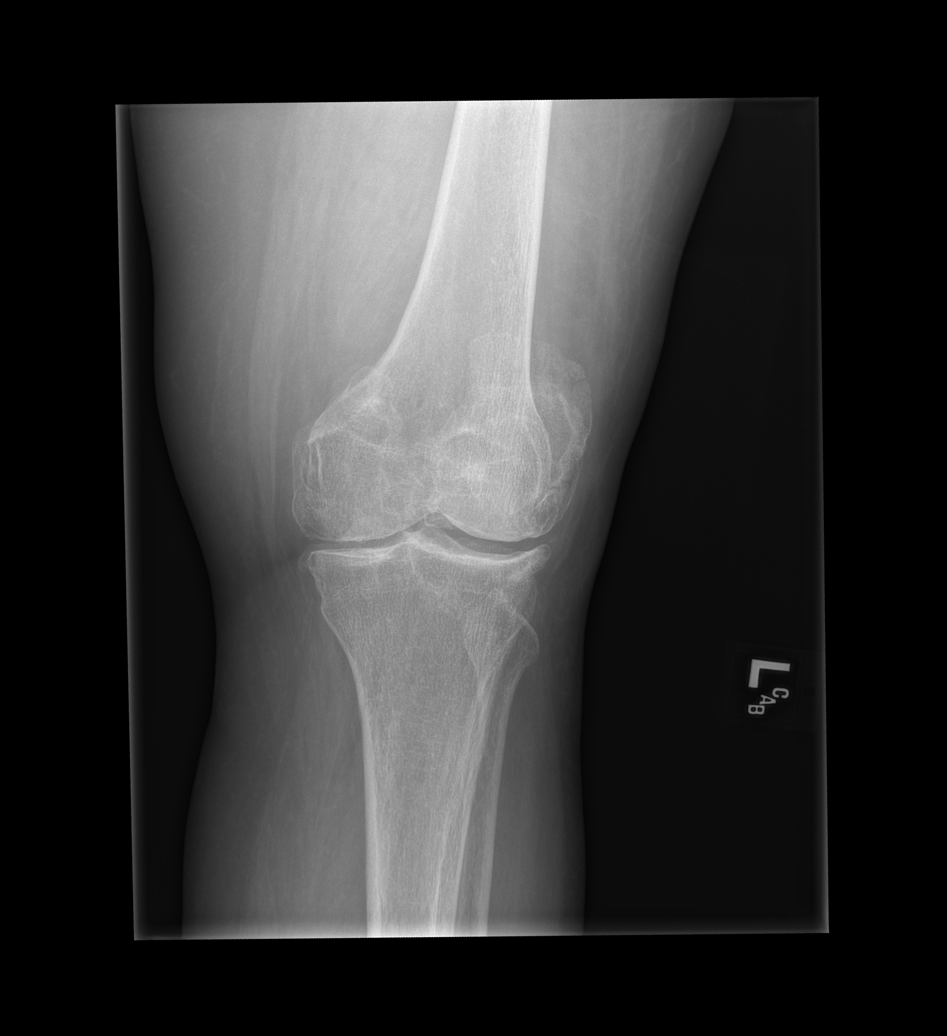
[im 4/4]
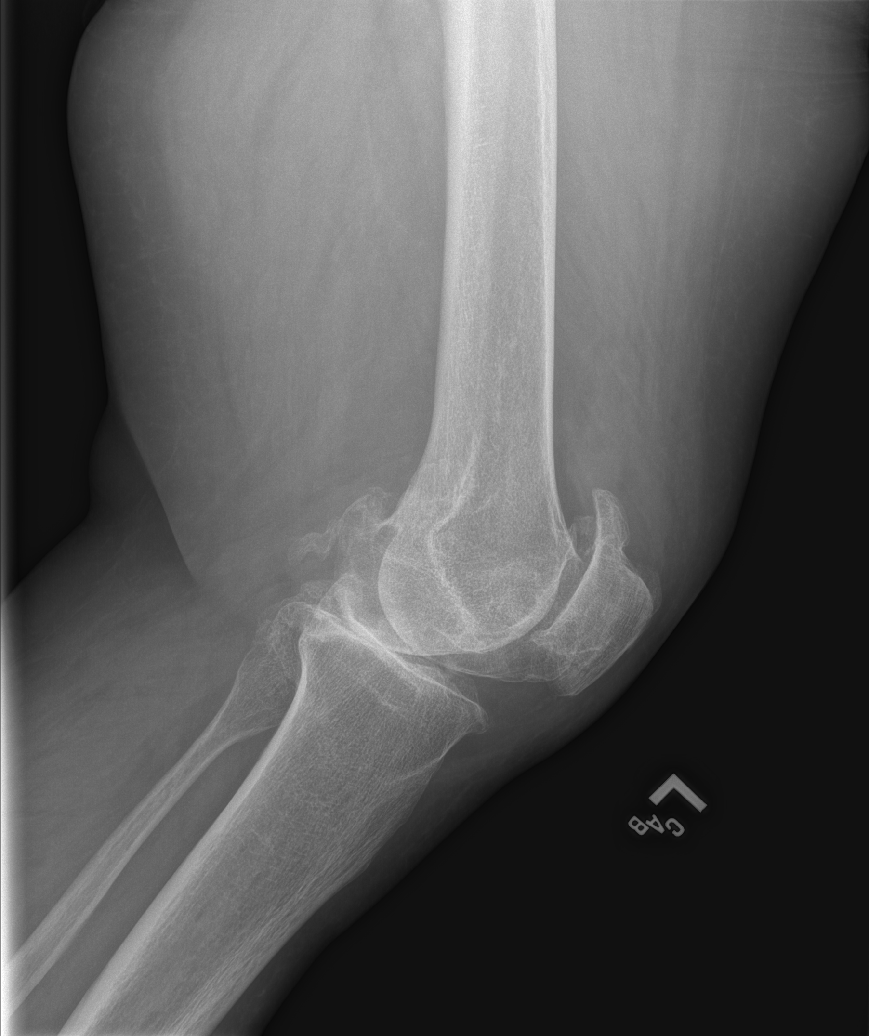

[4 of 4 positions shown; findings below may reference images not displayed]

FINDINGS: Chronic severe tricompartmental knee joint degeneration with bulky
osteophytosis re - demonstrated. Suggestion of small suprapatellar
joint effusion today. New curvilinear lucency through the distal
pole of the patella seen on images 3 and 4, where as superior
lateral patellar irregularity is stable since 9791. No displacement.
No other acute osseous abnormality identified.
IMPRESSION: 1. Suspicion of nondisplaced fracture through the distal pole of the
patella. Small joint effusion.
2. Underlying chronic severe tricompartmental left knee joint
degeneration.
